# Patient Record
Sex: Male | Born: 1965 | Race: Black or African American | Hispanic: No | Marital: Single | Smoking: Current every day smoker
Health system: Southern US, Community
[De-identification: ages and names within clinical notes are randomized; demographics above are authoritative.]

## PROBLEM LIST (undated history)

## (undated) DIAGNOSIS — Z789 Other specified health status: Secondary | ICD-10-CM

## (undated) DIAGNOSIS — K56609 Unspecified intestinal obstruction, unspecified as to partial versus complete obstruction: Secondary | ICD-10-CM

## (undated) DIAGNOSIS — F1010 Alcohol abuse, uncomplicated: Secondary | ICD-10-CM

## (undated) HISTORY — PX: NO PAST SURGERIES: SHX2092

---

## 2005-04-22 ENCOUNTER — Observation Stay (HOSPITAL_COMMUNITY): Admission: RE | Admit: 2005-04-22 | Discharge: 2005-04-23 | Payer: Self-pay | Admitting: Urology

## 2005-04-22 ENCOUNTER — Encounter (INDEPENDENT_AMBULATORY_CARE_PROVIDER_SITE_OTHER): Payer: Self-pay | Admitting: Urology

## 2010-05-02 ENCOUNTER — Emergency Department (HOSPITAL_COMMUNITY): Admission: EM | Admit: 2010-05-02 | Discharge: 2010-05-02 | Payer: Self-pay | Admitting: Emergency Medicine

## 2010-12-06 NOTE — Op Note (Signed)
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   ANESTHESIA:  General endotracheal.   PROCEDURE:  The patient was given general endotracheal anesthesia, placed in  lithotomy position, usual prep and drape. There are large, giant condylomas  located in the left groin and right thigh, medial aspect. They were  circumscribed with the help of the cautery and excised totally, lifting off  the subcutaneous tissue. Bleeders were coagulated and skin was closed with  interrupted sutures of 3-0 Prolene. There was some large condylomas located  on the penile shaft. They were excised and skin closed with chromic stitch.  The multiple small penile condylomas were simply fulgurated. There was large  perianal condylomas. They were excised with the help of scissors. Then the  base was simply cauterized. Next, I proceed to remove the urethral  condylomas which are coming out of the meatus. They were fulgurated and  removed. I had to do ventral meatotomy to get to them. Whatever visual  portions, they were removed, and I did a cystoscopy. The rest of the urethra  and the bladder looks fine, and I left a #18 Foley catheter, and with a  couple of sutures of chromic, the meatus was closed on the ventral aspect to  approximate the glans penis. Then bacitracin ointment applied to the area  where the joint condylomas were removed, and they were dressed. The rest of  the area was simply applied bacitracin. The patient left the operating room  in satisfactory condition.      Ky Barban, M.D.  Electronically Signed     MIJ/MEDQ  D:  04/22/2005  T:  04/22/2005  Job:  [id number]

## 2010-12-06 NOTE — H&P (Signed)
NAME:  Christopher English, Christopher English NO.:  [id number]   MEDICAL RECORD NO.:  [id number]          PATIENT TYPE:  AMB   LOCATION:  DAY                           FACILITY:  [facility]   PHYSICIAN:  Ky Barban, M.D.DATE OF BIRTH:  [birth year]   DATE OF ADMISSION:  DATE OF DISCHARGE:  LH                                HISTORY & PHYSICAL   CHIEF COMPLAINT:  Multiple genital warts.   HISTORY OF PRESENT ILLNESS:  A 45 year old gentleman who was referred to me  by Dr. __________.  He was found to have multiple warts, which are giant,  located for several years in the genital area; on the penis, scrotum, and on  his anus.  No other urological complaints.  I have advised him to have  excision and fulguration of these warts under anesthesia as an outpatient.   PAST HISTORY:  No history of diabetes or hypertension.   FAMILY HISTORY:  Negative.   ALLERGIES:  NONE.   MEDICATIONS:  None.   EXAMINATION:  VITAL SIGNS:  Blood pressure 140/86, temperature 98.7.  CENTRAL NERVOUS SYSTEM:  Negative.  HEAD/NECK/ENT:  Negative.  CHEST:  Symmetrical.  HEART:  Regular sinus rhythm.  ABDOMEN:  Soft, flat.  Liver, spleen, and kidneys are not palpable.  No  severe tenderness.  GENITOURINARY:  External genitalia reveals multiple warts on the penal shaft  and the scrotum.  They are big and some of them appear to be infected.  There are some around the anal area and I also see one coming out of the  urethral meatus.   IMPRESSION:  Multiple genital warts.   PLAN:  Excision under anesthesia.  I may have to even do a cystoscopy.  It  depends what we find.  He understands and wants me to go ahead and proceed.     Ky Barban, M.D.  Electronically Signed    MIJ/MEDQ  D:  04/21/2005  T:  04/21/2005  Job:  [id number]

## 2011-07-10 ENCOUNTER — Inpatient Hospital Stay (HOSPITAL_COMMUNITY)
Admission: EM | Admit: 2011-07-10 | Discharge: 2011-07-14 | DRG: 201 | Disposition: A | Discharge status: Home / Self Care | Payer: Self-pay

## 2011-07-10 ENCOUNTER — Emergency Department (HOSPITAL_COMMUNITY): Payer: Self-pay

## 2011-07-10 ENCOUNTER — Encounter (HOSPITAL_COMMUNITY): Payer: Self-pay | Admitting: Physician Assistant

## 2011-07-10 DIAGNOSIS — S41012A Laceration without foreign body of left shoulder, initial encounter: Secondary | ICD-10-CM | POA: Diagnosis present

## 2011-07-10 DIAGNOSIS — Y92009 Unspecified place in unspecified non-institutional (private) residence as the place of occurrence of the external cause: Secondary | ICD-10-CM

## 2011-07-10 DIAGNOSIS — Y998 Other external cause status: Secondary | ICD-10-CM

## 2011-07-10 DIAGNOSIS — S21209A Unspecified open wound of unspecified back wall of thorax without penetration into thoracic cavity, initial encounter: Secondary | ICD-10-CM | POA: Diagnosis present

## 2011-07-10 DIAGNOSIS — S2190XA Unspecified open wound of unspecified part of thorax, initial encounter: Principal | ICD-10-CM | POA: Diagnosis present

## 2011-07-10 DIAGNOSIS — S0191XA Laceration without foreign body of unspecified part of head, initial encounter: Secondary | ICD-10-CM | POA: Diagnosis present

## 2011-07-10 DIAGNOSIS — S41009A Unspecified open wound of unspecified shoulder, initial encounter: Secondary | ICD-10-CM

## 2011-07-10 DIAGNOSIS — S0100XA Unspecified open wound of scalp, initial encounter: Secondary | ICD-10-CM | POA: Diagnosis present

## 2011-07-10 DIAGNOSIS — Z720 Tobacco use: Secondary | ICD-10-CM

## 2011-07-10 DIAGNOSIS — S270XXA Traumatic pneumothorax, initial encounter: Secondary | ICD-10-CM

## 2011-07-10 DIAGNOSIS — S0180XA Unspecified open wound of other part of head, initial encounter: Secondary | ICD-10-CM | POA: Diagnosis present

## 2011-07-10 DIAGNOSIS — S21119A Laceration without foreign body of unspecified front wall of thorax without penetration into thoracic cavity, initial encounter: Secondary | ICD-10-CM | POA: Diagnosis present

## 2011-07-10 DIAGNOSIS — T148XXA Other injury of unspecified body region, initial encounter: Secondary | ICD-10-CM

## 2011-07-10 DIAGNOSIS — S0190XA Unspecified open wound of unspecified part of head, initial encounter: Secondary | ICD-10-CM

## 2011-07-10 HISTORY — DX: Other specified health status: Z78.9

## 2011-07-10 LAB — CBC
HCT: 42.3 % (ref 39.0–52.0)
Hemoglobin: 14.2 g/dL (ref 13.0–17.0)
MCH: 32.1 pg (ref 26.0–34.0)
MCHC: 33.6 g/dL (ref 30.0–36.0)
MCV: 95.7 fL (ref 78.0–100.0)
RDW: 12 % (ref 11.5–15.5)

## 2011-07-10 LAB — URINALYSIS, MICROSCOPIC ONLY
Bilirubin Urine: NEGATIVE
Glucose, UA: NEGATIVE mg/dL
Protein, ur: 30 mg/dL — AB
Urobilinogen, UA: 0.2 mg/dL (ref 0.0–1.0)

## 2011-07-10 LAB — POCT I-STAT, CHEM 8
Calcium, Ion: 1.13 mmol/L (ref 1.12–1.32)
Glucose, Bld: 133 mg/dL — ABNORMAL HIGH (ref 70–99)
HCT: 45 % (ref 39.0–52.0)
Hemoglobin: 15.3 g/dL (ref 13.0–17.0)

## 2011-07-10 LAB — COMPREHENSIVE METABOLIC PANEL
ALT: 24 U/L (ref 0–53)
AST: 37 U/L (ref 0–37)
Albumin: 3.8 g/dL (ref 3.5–5.2)
Alkaline Phosphatase: 61 U/L (ref 39–117)
Calcium: 9.1 mg/dL (ref 8.4–10.5)
GFR calc Af Amer: 82 mL/min — ABNORMAL LOW (ref >90)
Glucose, Bld: 134 mg/dL — ABNORMAL HIGH (ref 70–99)
Potassium: 3.7 mEq/L (ref 3.5–5.1)
Sodium: 136 mEq/L (ref 135–145)
Total Protein: 7.1 g/dL (ref 6.0–8.3)

## 2011-07-10 LAB — PROTIME-INR: Prothrombin Time: 13.8 seconds (ref 11.6–15.2)

## 2011-07-10 MED ORDER — MORPHINE SULFATE 2 MG/ML IJ SOLN
INTRAMUSCULAR | Status: AC
  Administered 2011-07-11: 2 mg via INTRAVENOUS
  Filled 2011-07-10: qty 1

## 2011-07-10 MED ORDER — IOHEXOL 350 MG/ML SOLN
80.0000 mL | Freq: Once | INTRAVENOUS | Status: AC | PRN
  Administered 2011-07-10: 80 mL via INTRAVENOUS

## 2011-07-10 MED ORDER — POTASSIUM CHLORIDE IN NACL 20-0.9 MEQ/L-% IV SOLN
INTRAVENOUS | Status: DC
  Administered 2011-07-10: 17:00:00 via INTRAVENOUS
  Administered 2011-07-11: 20 mL/h via INTRAVENOUS
  Administered 2011-07-12: 05:00:00 via INTRAVENOUS
  Filled 2011-07-10 (×4): qty 1000

## 2011-07-10 MED ORDER — HYDROCODONE-ACETAMINOPHEN 5-325 MG PO TABS
2.0000 | ORAL_TABLET | ORAL | Status: DC | PRN
  Administered 2011-07-10 – 2011-07-13 (×8): 2 via ORAL
  Filled 2011-07-10 (×8): qty 2

## 2011-07-10 MED ORDER — TETANUS-DIPHTH-ACELL PERTUSSIS 5-2.5-18.5 LF-MCG/0.5 IM SUSP
0.5000 mL | Freq: Once | INTRAMUSCULAR | Status: AC
  Administered 2011-07-10: 0.5 mL via INTRAMUSCULAR

## 2011-07-10 MED ORDER — TETANUS-DIPHTH-ACELL PERTUSSIS 5-2.5-18.5 LF-MCG/0.5 IM SUSP
INTRAMUSCULAR | Status: AC
  Filled 2011-07-10: qty 0.5

## 2011-07-10 MED ORDER — DEXTROSE 5 % IV SOLN
1000.0000 mg | INTRAVENOUS | Status: AC | PRN
  Administered 2011-07-10: 1000 mg via INTRAVENOUS

## 2011-07-10 MED ORDER — FENTANYL CITRATE 0.05 MG/ML IJ SOLN
INTRAMUSCULAR | Status: AC | PRN
  Administered 2011-07-10: 100 ug via INTRAVENOUS

## 2011-07-10 MED ORDER — PANTOPRAZOLE SODIUM 40 MG IV SOLR
40.0000 mg | Freq: Every day | INTRAVENOUS | Status: DC
  Administered 2011-07-10: 40 mg via INTRAVENOUS
  Filled 2011-07-10 (×5): qty 40

## 2011-07-10 MED ORDER — BISACODYL 10 MG RE SUPP: 10.0000 mg | Freq: Every day | RECTAL | Status: DC | PRN

## 2011-07-10 MED ORDER — PANTOPRAZOLE SODIUM 40 MG PO TBEC
40.0000 mg | DELAYED_RELEASE_TABLET | Freq: Every day | ORAL | Status: DC
  Administered 2011-07-11 – 2011-07-13 (×3): 40 mg via ORAL
  Filled 2011-07-10 (×3): qty 1

## 2011-07-10 MED ORDER — FENTANYL CITRATE 0.05 MG/ML IJ SOLN
INTRAMUSCULAR | Status: AC
  Filled 2011-07-10: qty 2

## 2011-07-10 MED ORDER — DOCUSATE SODIUM 100 MG PO CAPS
100.0000 mg | ORAL_CAPSULE | Freq: Two times a day (BID) | ORAL | Status: DC
  Administered 2011-07-10 – 2011-07-13 (×7): 100 mg via ORAL
  Filled 2011-07-10 (×8): qty 1

## 2011-07-10 MED ORDER — HYDROCODONE-ACETAMINOPHEN 5-325 MG PO TABS: 0.5000 | ORAL_TABLET | ORAL | Status: DC | PRN

## 2011-07-10 MED ORDER — CEFAZOLIN SODIUM 1-5 GM-% IV SOLN
INTRAVENOUS | Status: AC
  Filled 2011-07-10: qty 50

## 2011-07-10 MED ORDER — MIDAZOLAM HCL 5 MG/5ML IJ SOLN
INTRAMUSCULAR | Status: AC | PRN
  Administered 2011-07-10: 4 mg via INTRAVENOUS

## 2011-07-10 MED ORDER — MIDAZOLAM HCL 2 MG/2ML IJ SOLN
INTRAMUSCULAR | Status: AC
  Filled 2011-07-10: qty 4

## 2011-07-10 MED ORDER — ONDANSETRON HCL 4 MG/2ML IJ SOLN: 4.0000 mg | Freq: Four times a day (QID) | INTRAMUSCULAR | Status: DC | PRN

## 2011-07-10 MED ORDER — SODIUM CHLORIDE 0.9 % IV BOLUS (SEPSIS)
1000.0000 mL | Freq: Once | INTRAVENOUS | Status: AC
  Administered 2011-07-10: 1000 mL via INTRAVENOUS

## 2011-07-10 MED ORDER — ONDANSETRON HCL 4 MG PO TABS: 4.0000 mg | ORAL_TABLET | Freq: Four times a day (QID) | ORAL | Status: DC | PRN

## 2011-07-10 MED ORDER — MORPHINE SULFATE 2 MG/ML IJ SOLN
2.0000 mg | INTRAMUSCULAR | Status: DC | PRN
  Administered 2011-07-10 – 2011-07-13 (×9): 2 mg via INTRAVENOUS
  Filled 2011-07-10 (×9): qty 1

## 2011-07-10 MED ORDER — HYDROCODONE-ACETAMINOPHEN 5-325 MG PO TABS
1.0000 | ORAL_TABLET | ORAL | Status: DC | PRN
  Administered 2011-07-11 (×2): 1 via ORAL
  Filled 2011-07-10 (×2): qty 1
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
## 2011-07-10 NOTE — Procedures (Signed)
The physician assistant was supervised during this procedure, and 28Fr trocar CT placed without event.  CXR done confirmed good placement.

## 2011-07-10 NOTE — Procedures (Signed)
Chest Tube Insertion Procedure Note  Indications:  Clinically significant Pneumothorax and Hemothorax approx 30%  Pre-operative Diagnosis: Pneumothorax and Hemothorax  Post-operative Diagnosis: Pneumothorax and Hemothorax  Procedure Details  Informed consent was obtained for the procedure, including sedation.  Risks of lung perforation, hemorrhage, arrhythmia, and adverse drug reaction were discussed.   After sterile skin prep, using standard technique, a 28 French tube was placed in the left lateral 4th rib space.  Findings: Immediate release of air  Estimated Blood Loss:  Minimal         Specimens:  None              Complications:  None; patient tolerated the procedure well.         Disposition: SDU         Condition: stable  Attending: Dr Jimmye Norman   Physician Assistant: Iver Nestle, PAC

## 2011-07-10 NOTE — ED Notes (Signed)
Left hand laceration repaired with sutures by md
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
## 2011-07-10 NOTE — ED Notes (Signed)
[id number] ready 
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
## 2011-07-10 NOTE — ED Notes (Signed)
Er md in to repair lacerations to scalp with staples.

## 2011-07-10 NOTE — ED Notes (Signed)
md removing forehead staple and placing sutures instead.

## 2011-07-10 NOTE — ED Notes (Signed)
Lacerations to left shoulder and forehead stapled as well.
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
## 2011-07-10 NOTE — ED Notes (Signed)
Back from ct, aware of plans for chest tube.

## 2011-07-10 NOTE — ED Notes (Signed)
co2 result given to Md at bedside.

## 2011-07-11 ENCOUNTER — Inpatient Hospital Stay (HOSPITAL_COMMUNITY): Payer: Self-pay

## 2011-07-11 LAB — CBC
HCT: 37.4 % — ABNORMAL LOW (ref 39.0–52.0)
MCH: 31.2 pg (ref 26.0–34.0)
MCHC: 33.4 g/dL (ref 30.0–36.0)
MCV: 93.3 fL (ref 78.0–100.0)
Platelets: 115 10*3/uL — ABNORMAL LOW (ref 150–400)
RDW: 12.1 % (ref 11.5–15.5)
WBC: 6.1 10*3/uL (ref 4.0–10.5)

## 2011-07-11 LAB — TYPE AND SCREEN
ABO/RH(D): O POS
Antibody Screen: NEGATIVE
Unit division: 0
Unit division: 0

## 2011-07-11 LAB — PROTIME-INR: Prothrombin Time: 13.3 seconds (ref 11.6–15.2)

## 2011-07-11 LAB — BASIC METABOLIC PANEL
BUN: 8 mg/dL (ref 6–23)
Calcium: 8.9 mg/dL (ref 8.4–10.5)
Chloride: 102 mEq/L (ref 96–112)
Creatinine, Ser: 0.83 mg/dL (ref 0.50–1.35)
GFR calc Af Amer: >90 mL/min (ref >90)

## 2011-07-11 MED ORDER — IPRATROPIUM-ALBUTEROL 18-103 MCG/ACT IN AERO
2.0000 | INHALATION_SPRAY | Freq: Four times a day (QID) | RESPIRATORY_TRACT | Status: DC | PRN
  Filled 2011-07-11: qty 14.7
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
## 2011-07-12 ENCOUNTER — Inpatient Hospital Stay (HOSPITAL_COMMUNITY): Payer: Self-pay

## 2011-07-12 LAB — CBC
HCT: 36.2 % — ABNORMAL LOW (ref 39.0–52.0)
MCH: 31.2 pg (ref 26.0–34.0)
MCHC: 33.7 g/dL (ref 30.0–36.0)
MCV: 92.6 fL (ref 78.0–100.0)
RDW: 11.9 % (ref 11.5–15.5)
WBC: 3.9 10*3/uL — ABNORMAL LOW (ref 4.0–10.5)

## 2011-07-12 LAB — BASIC METABOLIC PANEL
BUN: 6 mg/dL (ref 6–23)
Calcium: 9.2 mg/dL (ref 8.4–10.5)
Chloride: 102 mEq/L (ref 96–112)
Creatinine, Ser: 0.88 mg/dL (ref 0.50–1.35)
GFR calc Af Amer: >90 mL/min (ref >90)
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
## 2011-07-13 ENCOUNTER — Inpatient Hospital Stay (HOSPITAL_COMMUNITY): Payer: Self-pay

## 2011-07-13 NOTE — Progress Notes (Signed)
  Subjective: Doing well. Tolerated CT to water seal. No SOB. MIld pains  Objective: Vital signs in last 24 hours: Temp:  [98.3 F (36.8 C)-98.6 F (37 C)] 98.3 F (36.8 C) (12/23 0500) Pulse Rate:  [64-80] 64  (12/23 0500) Resp:  [18-20] 18  (12/23 0500) BP: (123-146)/(78-83) 133/78 mmHg (12/23 0500) SpO2:  [96 %-100 %] 100 % (12/23 0500) Last BM Date: 07/09/11  Intake/Output this shift:    Physical Exam: BP 133/78  Pulse 64  Temp(Src) 98.3 F (36.8 C) (Oral)  Resp 18  Ht 5\' 8"  (1.727 m)  Wt 152 lb (68.947 kg)  BMI 23.11 kg/m2  SpO2 100% Lungs: CTA, no air leak  Labs: CBC  Basename 07/12/11 0700 07/11/11 0420  WBC 3.9* 6.1  HGB 12.2* 12.5*  HCT 36.2* 37.4*  PLT 117* 115*   BMET  Basename 07/12/11 0700 07/11/11 0420  NA 138 133*  K 3.7 4.0  CL 102 102  CO2 25 22  GLUCOSE 87 99  BUN 6 8  CREATININE 0.88 0.83  CALCIUM 9.2 8.9   LFT  Basename 07/10/11 1356  PROT 7.1  ALBUMIN 3.8  AST 37  ALT 24  ALKPHOS 61  BILITOT 0.2*  BILIDIR --  IBILI --  LIPASE --   PT/INR  Basename 07/11/11 0420 07/10/11 1356  LABPROT 13.3 13.8  INR 0.99 1.04   ABG No results found for this basename: PHART:2,PCO2:2,PO2:2,HCO3:2 in the last 72 hours  Studies/Results: Dg Chest Port 1 View  07/12/2011  *RADIOLOGY REPORT*  Clinical Data: Pneumothorax, chest pain  PORTABLE CHEST - 1 VIEW  Comparison:   the previous day's study  Findings: Left chest tube stable in position.  No pneumothorax. Improved aeration with minimal residual infrahilar subsegmental atelectasis or infiltrates, left greater than right.  No definite effusion.  Heart size normal.  IMPRESSION:  1.  Stable left chest with no pneumothorax. 2.  Improved aeration with minimal residual infrahilar atelectasis/infiltrates.  Original Report Authenticated By: Osa Craver, M.D.    Assessment: Active Problems:  * No active hospital problems. *      Plan: Await CXR report, hopefully DC CT later  today  LOS: 3 days    Ahrianna Siglin,Plummer J 07/13/2011

## 2011-07-13 NOTE — Progress Notes (Signed)
CXR ok, no PTX on water seal. Chest tube pulled, no issues, no SOB Air-tight dressing placed.
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
## 2011-07-14 DIAGNOSIS — Z720 Tobacco use: Secondary | ICD-10-CM

## 2011-07-14 MED ORDER — IPRATROPIUM-ALBUTEROL 18-103 MCG/ACT IN AERO: 2.0000 | INHALATION_SPRAY | Freq: Four times a day (QID) | RESPIRATORY_TRACT | Status: DC | PRN

## 2011-07-14 MED ORDER — HYDROCODONE-ACETAMINOPHEN 5-325 MG PO TABS: 1.0000 | ORAL_TABLET | ORAL | Status: AC | PRN
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
## 2011-07-14 NOTE — Discharge Summary (Signed)
Agree Hassaan Crite E  

## 2011-07-14 NOTE — Progress Notes (Signed)
Agree Christopher English  

## 2011-07-24 ENCOUNTER — Encounter (INDEPENDENT_AMBULATORY_CARE_PROVIDER_SITE_OTHER): Payer: Self-pay

## 2011-10-26 ENCOUNTER — Emergency Department (HOSPITAL_COMMUNITY)
Admission: EM | Admit: 2011-10-26 | Discharge: 2011-10-26 | Disposition: A | Discharge status: Home / Self Care | Payer: Self-pay | Attending: Emergency Medicine | Admitting: Emergency Medicine

## 2011-10-26 ENCOUNTER — Encounter (HOSPITAL_COMMUNITY): Payer: Self-pay

## 2011-10-26 DIAGNOSIS — R4182 Altered mental status, unspecified: Secondary | ICD-10-CM | POA: Insufficient documentation

## 2011-10-26 DIAGNOSIS — Z1331 Encounter for screening for depression: Secondary | ICD-10-CM | POA: Insufficient documentation

## 2011-10-26 HISTORY — DX: Alcohol abuse, uncomplicated: F10.10

## 2011-10-26 LAB — COMPREHENSIVE METABOLIC PANEL WITH GFR
ALT: 50 U/L (ref 0–53)
AST: 85 U/L — ABNORMAL HIGH (ref 0–37)
Albumin: 4.3 g/dL (ref 3.5–5.2)
Alkaline Phosphatase: 65 U/L (ref 39–117)
BUN: 9 mg/dL (ref 6–23)
CO2: 25 meq/L (ref 19–32)
Calcium: 9.4 mg/dL (ref 8.4–10.5)
Chloride: 102 meq/L (ref 96–112)
Creatinine, Ser: 0.89 mg/dL (ref 0.50–1.35)
GFR calc Af Amer: >90 mL/min (ref >90)
GFR calc non Af Amer: >90 mL/min (ref >90)
Glucose, Bld: 88 mg/dL (ref 70–99)
Potassium: 3.9 meq/L (ref 3.5–5.1)
Sodium: 141 meq/L (ref 135–145)
Total Bilirubin: 0.2 mg/dL — ABNORMAL LOW (ref 0.3–1.2)
Total Protein: 7.5 g/dL (ref 6.0–8.3)

## 2011-10-26 LAB — ETHANOL: Alcohol, Ethyl (B): 177 mg/dL — ABNORMAL HIGH (ref 0–11)

## 2011-10-26 LAB — CBC
HCT: 43.3 % (ref 39.0–52.0)
Hemoglobin: 14.6 g/dL (ref 13.0–17.0)
MCH: 31.7 pg (ref 26.0–34.0)
MCHC: 33.7 g/dL (ref 30.0–36.0)
MCV: 93.9 fL (ref 78.0–100.0)
Platelets: 109 K/uL — ABNORMAL LOW (ref 150–400)
RBC: 4.61 MIL/uL (ref 4.22–5.81)
RDW: 12.5 % (ref 11.5–15.5)
WBC: 3.4 K/uL — ABNORMAL LOW (ref 4.0–10.5)

## 2011-10-26 LAB — URINALYSIS, ROUTINE W REFLEX MICROSCOPIC
Glucose, UA: NEGATIVE mg/dL
Hgb urine dipstick: NEGATIVE
Ketones, ur: NEGATIVE mg/dL
Protein, ur: NEGATIVE mg/dL

## 2011-10-26 LAB — RAPID URINE DRUG SCREEN, HOSP PERFORMED
Amphetamines: NOT DETECTED
Barbiturates: NOT DETECTED
Benzodiazepines: POSITIVE — AB
Cocaine: NOT DETECTED
Opiates: NOT DETECTED
Tetrahydrocannabinol: POSITIVE — AB
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
## 2011-10-26 NOTE — ED Notes (Signed)
Brought to er by [facility], w./ ivc papers in place, stated he wanted to kill self--was told to his mother. Pt denies any comment, denies si/hi

## 2011-10-26 NOTE — Discharge Instructions (Signed)
Follow up as needed

## 2011-10-26 NOTE — ED Notes (Signed)
Pt DC to home with instructions to return for worsening sx or SI/HI.  Pt verbalized understanding.  Pt given clothes and belongings
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
## 2018-06-20 DIAGNOSIS — K56609 Unspecified intestinal obstruction, unspecified as to partial versus complete obstruction: Secondary | ICD-10-CM

## 2018-06-20 HISTORY — DX: Unspecified intestinal obstruction, unspecified as to partial versus complete obstruction: K56.609

## 2018-06-21 ENCOUNTER — Encounter (HOSPITAL_COMMUNITY): Payer: Self-pay

## 2018-06-21 ENCOUNTER — Inpatient Hospital Stay (HOSPITAL_COMMUNITY)
Admission: EM | Admit: 2018-06-21 | Discharge: 2018-07-21 | DRG: 388 | Disposition: E | Payer: Medicaid Other | Attending: Pulmonary Disease | Admitting: Pulmonary Disease

## 2018-06-21 ENCOUNTER — Emergency Department (HOSPITAL_COMMUNITY): Payer: Medicaid Other

## 2018-06-21 ENCOUNTER — Inpatient Hospital Stay (HOSPITAL_COMMUNITY): Payer: Medicaid Other

## 2018-06-21 DIAGNOSIS — K56699 Other intestinal obstruction unspecified as to partial versus complete obstruction: Secondary | ICD-10-CM | POA: Diagnosis present

## 2018-06-21 DIAGNOSIS — Z7189 Other specified counseling: Secondary | ICD-10-CM

## 2018-06-21 DIAGNOSIS — D696 Thrombocytopenia, unspecified: Secondary | ICD-10-CM

## 2018-06-21 DIAGNOSIS — C189 Malignant neoplasm of colon, unspecified: Secondary | ICD-10-CM | POA: Diagnosis present

## 2018-06-21 DIAGNOSIS — R6881 Early satiety: Secondary | ICD-10-CM | POA: Diagnosis present

## 2018-06-21 DIAGNOSIS — R6521 Severe sepsis with septic shock: Secondary | ICD-10-CM | POA: Diagnosis not present

## 2018-06-21 DIAGNOSIS — N179 Acute kidney failure, unspecified: Secondary | ICD-10-CM | POA: Diagnosis present

## 2018-06-21 DIAGNOSIS — Z515 Encounter for palliative care: Secondary | ICD-10-CM | POA: Diagnosis present

## 2018-06-21 DIAGNOSIS — E874 Mixed disorder of acid-base balance: Secondary | ICD-10-CM | POA: Diagnosis not present

## 2018-06-21 DIAGNOSIS — Z720 Tobacco use: Secondary | ICD-10-CM | POA: Diagnosis present

## 2018-06-21 DIAGNOSIS — Z4659 Encounter for fitting and adjustment of other gastrointestinal appliance and device: Secondary | ICD-10-CM

## 2018-06-21 DIAGNOSIS — F1010 Alcohol abuse, uncomplicated: Secondary | ICD-10-CM

## 2018-06-21 DIAGNOSIS — I4901 Ventricular fibrillation: Secondary | ICD-10-CM | POA: Diagnosis not present

## 2018-06-21 DIAGNOSIS — Z978 Presence of other specified devices: Secondary | ICD-10-CM

## 2018-06-21 DIAGNOSIS — E871 Hypo-osmolality and hyponatremia: Secondary | ICD-10-CM | POA: Diagnosis present

## 2018-06-21 DIAGNOSIS — E119 Type 2 diabetes mellitus without complications: Secondary | ICD-10-CM | POA: Diagnosis present

## 2018-06-21 DIAGNOSIS — R579 Shock, unspecified: Secondary | ICD-10-CM

## 2018-06-21 DIAGNOSIS — K6389 Other specified diseases of intestine: Secondary | ICD-10-CM | POA: Diagnosis present

## 2018-06-21 DIAGNOSIS — I469 Cardiac arrest, cause unspecified: Secondary | ICD-10-CM | POA: Diagnosis not present

## 2018-06-21 DIAGNOSIS — F1020 Alcohol dependence, uncomplicated: Secondary | ICD-10-CM | POA: Diagnosis present

## 2018-06-21 DIAGNOSIS — E860 Dehydration: Secondary | ICD-10-CM | POA: Diagnosis present

## 2018-06-21 DIAGNOSIS — D6959 Other secondary thrombocytopenia: Secondary | ICD-10-CM | POA: Diagnosis present

## 2018-06-21 DIAGNOSIS — E876 Hypokalemia: Secondary | ICD-10-CM | POA: Diagnosis present

## 2018-06-21 DIAGNOSIS — J690 Pneumonitis due to inhalation of food and vomit: Secondary | ICD-10-CM | POA: Diagnosis not present

## 2018-06-21 DIAGNOSIS — J9601 Acute respiratory failure with hypoxia: Secondary | ICD-10-CM | POA: Diagnosis not present

## 2018-06-21 DIAGNOSIS — A419 Sepsis, unspecified organism: Secondary | ICD-10-CM | POA: Diagnosis not present

## 2018-06-21 DIAGNOSIS — K56609 Unspecified intestinal obstruction, unspecified as to partial versus complete obstruction: Secondary | ICD-10-CM | POA: Diagnosis present

## 2018-06-21 DIAGNOSIS — F17210 Nicotine dependence, cigarettes, uncomplicated: Secondary | ICD-10-CM | POA: Diagnosis present

## 2018-06-21 DIAGNOSIS — Z9289 Personal history of other medical treatment: Secondary | ICD-10-CM

## 2018-06-21 DIAGNOSIS — R1084 Generalized abdominal pain: Secondary | ICD-10-CM | POA: Diagnosis present

## 2018-06-21 DIAGNOSIS — Z452 Encounter for adjustment and management of vascular access device: Secondary | ICD-10-CM

## 2018-06-21 HISTORY — DX: Unspecified intestinal obstruction, unspecified as to partial versus complete obstruction: K56.609

## 2018-06-21 LAB — CBC WITH DIFFERENTIAL/PLATELET
Abs Immature Granulocytes: 0.02 10*3/uL (ref 0.00–0.07)
BASOS ABS: 0 10*3/uL (ref 0.0–0.1)
Basophils Relative: 0 %
EOS PCT: 0 %
Eosinophils Absolute: 0 10*3/uL (ref 0.0–0.5)
HEMATOCRIT: 50.2 % (ref 39.0–52.0)
HEMOGLOBIN: 16.9 g/dL (ref 13.0–17.0)
IMMATURE GRANULOCYTES: 0 %
LYMPHS ABS: 1.1 10*3/uL (ref 0.7–4.0)
LYMPHS PCT: 15 %
MCH: 31.5 pg (ref 26.0–34.0)
MCHC: 33.7 g/dL (ref 30.0–36.0)
MCV: 93.5 fL (ref 80.0–100.0)
MONOS PCT: 6 %
Monocytes Absolute: 0.4 10*3/uL (ref 0.1–1.0)
NRBC: 0 % (ref 0.0–0.2)
Neutro Abs: 5.5 10*3/uL (ref 1.7–7.7)
Neutrophils Relative %: 79 %
Platelets: 138 10*3/uL — ABNORMAL LOW (ref 150–400)
RBC: 5.37 MIL/uL (ref 4.22–5.81)
RDW: 12 % (ref 11.5–15.5)
WBC: 7 10*3/uL (ref 4.0–10.5)

## 2018-06-21 LAB — COMPREHENSIVE METABOLIC PANEL
ALK PHOS: 70 U/L (ref 38–126)
ALT: 19 U/L (ref 0–44)
AST: 25 U/L (ref 15–41)
Albumin: 4.7 g/dL (ref 3.5–5.0)
Anion gap: 15 (ref 5–15)
BILIRUBIN TOTAL: 0.9 mg/dL (ref 0.3–1.2)
BUN: 19 mg/dL (ref 6–20)
CO2: 23 mmol/L (ref 22–32)
Calcium: 9.8 mg/dL (ref 8.9–10.3)
Chloride: 96 mmol/L — ABNORMAL LOW (ref 98–111)
Creatinine, Ser: 1.54 mg/dL — ABNORMAL HIGH (ref 0.61–1.24)
GFR calc Af Amer: 59 mL/min — ABNORMAL LOW (ref >60)
GFR calc non Af Amer: 51 mL/min — ABNORMAL LOW (ref >60)
GLUCOSE: 137 mg/dL — AB (ref 70–99)
Potassium: 3.2 mmol/L — ABNORMAL LOW (ref 3.5–5.1)
Sodium: 134 mmol/L — ABNORMAL LOW (ref 135–145)
Total Protein: 8.6 g/dL — ABNORMAL HIGH (ref 6.5–8.1)

## 2018-06-21 LAB — I-STAT TROPONIN, ED: Troponin i, poc: 0.01 ng/mL (ref 0.00–0.08)

## 2018-06-21 LAB — LIPASE, BLOOD: Lipase: 20 U/L (ref 11–51)

## 2018-06-21 MED ORDER — MORPHINE SULFATE (PF) 4 MG/ML IV SOLN
4.0000 mg | Freq: Once | INTRAVENOUS | Status: AC
  Administered 2018-06-21: 4 mg via INTRAVENOUS
  Filled 2018-06-21: qty 1

## 2018-06-21 MED ORDER — PROPOFOL 10 MG/ML IV BOLUS: 0.5000 mg/kg | Freq: Once | INTRAVENOUS | Status: DC

## 2018-06-21 MED ORDER — LACTATED RINGERS IV SOLN
INTRAVENOUS | Status: DC
  Administered 2018-06-22: via INTRAVENOUS

## 2018-06-21 MED ORDER — ONDANSETRON HCL 4 MG/2ML IJ SOLN
4.0000 mg | Freq: Once | INTRAMUSCULAR | Status: AC
  Administered 2018-06-21: 4 mg via INTRAVENOUS
  Filled 2018-06-21: qty 2

## 2018-06-21 MED ORDER — HYDROMORPHONE HCL 1 MG/ML IJ SOLN
0.5000 mg | INTRAMUSCULAR | Status: DC | PRN
  Administered 2018-06-22 – 2018-06-23 (×7): 0.5 mg via INTRAVENOUS
  Filled 2018-06-21 (×7): qty 1

## 2018-06-21 MED ORDER — LIDOCAINE HCL (PF) 1 % IJ SOLN
INTRAMUSCULAR | Status: AC
  Filled 2018-06-21: qty 5

## 2018-06-21 MED ORDER — SODIUM CHLORIDE 0.9 % IV BOLUS
1000.0000 mL | Freq: Once | INTRAVENOUS | Status: AC
  Administered 2018-06-21: 1000 mL via INTRAVENOUS

## 2018-06-21 MED ORDER — FOLIC ACID 1 MG PO TABS: 1.0000 mg | ORAL_TABLET | Freq: Every day | ORAL | Status: DC

## 2018-06-21 MED ORDER — SODIUM CHLORIDE 0.9 % IV BOLUS
500.0000 mL | Freq: Once | INTRAVENOUS | Status: AC
  Administered 2018-06-21: 500 mL via INTRAVENOUS

## 2018-06-21 MED ORDER — LORAZEPAM 1 MG PO TABS: 1.0000 mg | ORAL_TABLET | Freq: Four times a day (QID) | ORAL | Status: DC | PRN

## 2018-06-21 MED ORDER — POTASSIUM CHLORIDE 10 MEQ/100ML IV SOLN
10.0000 meq | INTRAVENOUS | Status: AC
  Administered 2018-06-22 (×4): 10 meq via INTRAVENOUS
  Filled 2018-06-21 (×4): qty 100

## 2018-06-21 MED ORDER — THIAMINE HCL 100 MG/ML IJ SOLN
100.0000 mg | Freq: Every day | INTRAMUSCULAR | Status: DC
  Administered 2018-06-22 – 2018-06-24 (×3): 100 mg via INTRAVENOUS
  Filled 2018-06-21 (×3): qty 2

## 2018-06-21 MED ORDER — ONDANSETRON HCL 4 MG/2ML IJ SOLN
4.0000 mg | Freq: Four times a day (QID) | INTRAMUSCULAR | Status: DC | PRN
  Administered 2018-06-22 – 2018-06-23 (×3): 4 mg via INTRAVENOUS
  Filled 2018-06-21 (×4): qty 2

## 2018-06-21 MED ORDER — LORAZEPAM 2 MG/ML IJ SOLN: 1.0000 mg | Freq: Four times a day (QID) | INTRAMUSCULAR | Status: DC | PRN

## 2018-06-21 MED ORDER — LORAZEPAM 2 MG/ML IJ SOLN
0.5000 mg | Freq: Once | INTRAMUSCULAR | Status: AC
  Administered 2018-06-21: 0.5 mg via INTRAVENOUS
  Filled 2018-06-21: qty 1

## 2018-06-21 MED ORDER — IOHEXOL 300 MG/ML  SOLN
100.0000 mL | Freq: Once | INTRAMUSCULAR | Status: AC | PRN
  Administered 2018-06-21: 100 mL via INTRAVENOUS

## 2018-06-21 MED ORDER — PROPOFOL 10 MG/ML IV BOLUS
INTRAVENOUS | Status: AC
  Filled 2018-06-21: qty 20

## 2018-06-21 MED ORDER — LIDOCAINE HCL URETHRAL/MUCOSAL 2 % EX GEL
1.0000 "application " | Freq: Once | CUTANEOUS | Status: DC
  Filled 2018-06-21: qty 20

## 2018-06-21 MED ORDER — ENOXAPARIN SODIUM 40 MG/0.4ML ~~LOC~~ SOLN: 40.0000 mg | SUBCUTANEOUS | Status: DC

## 2018-06-21 MED ORDER — ADULT MULTIVITAMIN W/MINERALS CH: 1.0000 | ORAL_TABLET | Freq: Every day | ORAL | Status: DC

## 2018-06-21 MED ORDER — VITAMIN B-1 100 MG PO TABS
100.0000 mg | ORAL_TABLET | Freq: Every day | ORAL | Status: DC
  Filled 2018-06-21 (×2): qty 1

## 2018-06-21 NOTE — ED Notes (Signed)
3 attempts to insert NG tube.  Patient unable to tolerate procedure despite .5mg  ativan and 4 mg morphine and trying smallest catheter.  Pt crying and visibly upset and anxious after 3rd attempt. Pt states he will not try this again unless fully sedated. RN and family at bedside consoled pt. MD made aware NG tube insertion attempts unsuccessful.

## 2018-06-21 NOTE — ED Triage Notes (Signed)
Pt presents for evaluation of abdominal pain, vomiting, unable to keep fluids down for 3 days. Hx of ETOH abuse.

## 2018-06-21 NOTE — Consult Note (Signed)
Surgical Consultation Requesting provider: Cortni Couture PA-C  CC: abdominal pain, bloating, emesis  HPI: 52yo man presents with 4 day history of diffuse abdominal pain, nausea, emesis/ PO intolerance. Denies flatus or BM since onset of symptoms. Has had intermittent similar symptoms previously which were brief and tolerable but this episode has been unrelenting. Denies recent weight loss. Denies recent change in bowel movements, melena or hematochezia.   Denies known medical problems, does not take any medications.  Denies prior abdominal surgery.   Denies FH of cancer, FH positive for diabetes.   Drinks 48oz of beer daily. 1ppd smoker x 35 years. 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   No Known Allergies  Past Medical History:  Diagnosis Date  . Alcohol abuse   . No pertinent past medical history     Past Surgical History:  Procedure Laterality Date  . NO PAST SURGERIES      No family history on file.  Social History   Socioeconomic History  . Marital status: Single    Spouse name: Not on file  . Number of children: Not on file  . Years of education: Not on file  . Highest education level: Not on file  Occupational History  . Not on file  Social Needs  . Financial resource strain: Not on file  . Food insecurity:    Worry: Not on file    Inability: Not on file  . Transportation needs:    Medical: Not on file    Non-medical: Not on file  Tobacco Use  . Smoking status: Current Every Day Smoker    Types: Cigarettes  . Smokeless tobacco: Never Used  Substance and Sexual Activity  . Alcohol use: Yes    Comment: 07/11/11 "he consumes alot of beer"/mother  . Drug use: No  . Sexual activity: Yes  Lifestyle  . Physical activity:    Days per week: Not on file    Minutes per session: Not on file  . Stress: Not on file  Relationships  . Social connections:    Talks on phone: Not on file    Gets together: Not on  file    Attends religious service: Not on file    Active member of club or organization: Not on file    Attends meetings of clubs or organizations: Not on file    Relationship status: Not on file  Other Topics Concern  . Not on file  Social History Narrative  . Not on file    No current facility-administered medications on file prior to encounter.    No current outpatient medications on file prior to encounter.    Review of Systems: a complete, 10pt review of systems was completed with pertinent positives and negatives as documented in the HPI  Physical Exam: Vitals:   06/23/2018 1419  BP: (!) 139/102  Pulse: 86  Resp: 16  Temp: 98.2 F (36.8 C)  SpO2: 100%   Gen: A&Ox3, no distress  Head: normocephalic, atraumatic Eyes: extraocular motions intact, anicteric.  Neck: supple without mass or thyromegaly Chest: unlabored respirations, symmetrical air entry, clear bilaterally   Cardiovascular: RRR with palpable distal pulses, no pedal edema Abdomen: soft, distended, diffusely tender without peritonitis. No mass or organomegaly. No surgical scars.  Extremities: warm, without edema, no deformities  Neuro: grossly intact Psych: appropriate mood and affect, normal insight  Skin: warm and dry   CBC Latest Ref Rng & Units 06/28/2018 10/26/2011 07/12/2011  WBC 4.0 -  10.5 K/uL 7.0 3.4(L) 3.9(L)  Hemoglobin 13.0 - 17.0 g/dL 16.9 14.6 12.2(L)  Hematocrit 39.0 - 52.0 % 50.2 43.3 36.2(L)  Platelets 150 - 400 K/uL 138(L) 109(L) 117(L)    CMP Latest Ref Rng & Units 06/23/2018 10/26/2011 07/12/2011  Glucose 70 - 99 mg/dL 137(H) 88 87  BUN 6 - 20 mg/dL 19 9 6   Creatinine 0.61 - 1.24 mg/dL 1.54(H) 0.89 0.88  Sodium 135 - 145 mmol/L 134(L) 141 138  Potassium 3.5 - 5.1 mmol/L 3.2(L) 3.9 3.7  Chloride 98 - 111 mmol/L 96(L) 102 102  CO2 22 - 32 mmol/L 23 25 25   Calcium 8.9 - 10.3 mg/dL 9.8 9.4 9.2  Total Protein 6.5 - 8.1 g/dL 8.6(H) 7.5 -  Total Bilirubin 0.3 - 1.2 mg/dL 0.9 0.2(L) -   Alkaline Phos 38 - 126 U/L 70 65 -  AST 15 - 41 U/L 25 85(H) -  ALT 0 - 44 U/L 19 50 -    Lab Results  Component Value Date   INR 0.99 07/11/2011   INR 1.04 07/10/2011        Imaging: Ct Abdomen Pelvis W Contrast  Result Date: 06/20/2018 CLINICAL DATA:  Abdominal pain and distention with nausea and vomiting as well as constipation 4 days. EXAM: CT ABDOMEN AND PELVIS WITH CONTRAST TECHNIQUE: Multidetector CT imaging of the abdomen and pelvis was performed using the standard protocol following bolus administration of intravenous contrast. CONTRAST:  18mL OMNIPAQUE IOHEXOL 300 MG/ML  SOLN COMPARISON:  07/10/2011 FINDINGS: Lower chest: Unremarkable. Hepatobiliary: Liver, gallbladder and biliary tree are normal. Pancreas: Normal. Spleen: Normal Adrenals/Urinary Tract: Adrenal glands are normal. Kidneys are normal in size without hydronephrosis or nephrolithiasis. Ureters and bladder are normal. Stomach/Bowel: Stomach is normal. Proximal small bowel demonstrates evidence of mild wall thickening with multiple air and fluid-filled dilated mid to distal small bowel loops measuring up to 3.5 cm in diameter. This dilated small bowel extends to the ileocecal valve. There is air and fluid-filled mildly prominent colon to the level of the mid descending colon where there is focal narrowing and abnormal soft tissue density. This may represent a constricting neoplastic process. There is air and stool within the colon distal to this focal stricture. Vascular/Lymphatic: Vascular structures are unremarkable. No adenopathy. Reproductive: Normal. Other: No significant free fluid or focal inflammatory change. Musculoskeletal: Normal. 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 There are multiple air and fluid-filled dilated small bowel loops secondary to this obstructive  process. Recommended GI consultation for endoscopic correlation and biopsy. Mild wall thickening of a jejunal loops which is nonspecific. Electronically Signed   By: Marin Olp M.D.   On: 06/26/2018 17:32     A/P: 52yo man with large bowel obstruction likely malignancy. AKI/ dehydration. Hypokalemia. Hx Alcohol and tobacco abuse  GI has been consulted for biopsy Complete metastatic w/u with 2v CXR and CEA (ordered) NG decompression (ordered) Fluid resuscitation/ correct electrolytes CIWA/ etoh withdrawal management Likely partial colectomy this admission. I discussed the findings with the patient in detail. We discussed the risks of surgery including bleeding, infection, pain, scarring, ileus, injury to intraabdominal or retroperitoneal structures, incisional hernia, acute cardiac event, pneumonia, stroke, DVT/PE, and likelihood of needing a colostomy is high. Questions welcomed and answered. He will need ongoing counseling this admission as this was more than he was ready to take in tonight.    Lanora Reveron  Kae Heller, [facility], [place] Pager [phone]

## 2018-06-21 NOTE — H&P (Signed)
History and Physical    [name]:[id number] DOB: 10-Nov-1965 DOA: 07/03/2018  PCP: Patient, No Pcp Per Patient coming from: Home  Chief Complaint: Abdominal pain, vomiting  HPI: Christopher English is a 52 y.o. male with medical history significant of alcohol abuse presenting to the hospital for evaluation of abdominal pain and vomiting.  Patient is presenting with a 3-day history of abdominal distention, nausea, vomiting, and hiccups.  He has not been able to eat.  His abdominal pain is generalized and intermittently severe.  Denies having any fevers or chills.  He drinks two 24 ounce beers per day, last drink was 4 days ago.  Denies any history of alcohol withdrawal seizures or prior hospitalizations related to withdrawal.  Patient received a total of 8 mg morphine in the ED.  He is requesting a stronger pain medication for his abdominal pain.  He denies any history of unintentional weight loss, melena, or hematochezia.  Reports having fatigue for the past few months.  He smokes 1 pack of cigarettes per day.  ED Course: Afebrile and hemodynamically stable.  No leukocytosis.  Lipase and LFTs normal.  Creatinine 1.5, previously 0.8 six yrs ago.  No recent baseline.  I-STAT troponin negative.  EKG pending.  Chest x-ray showing no active cardiopulmonary disease.  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  There are multiple air and fluid-filled dilated small bowel loops secondary to this obstructive process.  Mild wall thickening of jejunal loops which is nonspecific.  Patient received morphine, Zofran, and 1 L normal saline bolus in the ED. GI and general surgery consulted by ED provider.  Review of Systems: As per HPI otherwise 10 point review of systems negative.  Past Medical History:  Diagnosis Date  . Alcohol abuse   . No  pertinent past medical history     Past Surgical History:  Procedure Laterality Date  . NO PAST SURGERIES       reports that he has been smoking cigarettes. He has never used smokeless tobacco. He reports that he drinks alcohol. He reports that he does not use drugs.  No Known Allergies  No family history on file.  Prior to Admission medications   Not on File    Physical Exam: Vitals:   07/09/2018 2252 07/18/2018 2300 07/01/2018 2315 06/22/18 0000  BP: 120/72 (!) 142/96 (!) 156/97 (!) 174/100  Pulse: 68 74 80 72  Resp: 18 (!) 21 11 16   Temp: 98.2 F (36.8 C)   98.2 F (36.8 C)  TempSrc: Oral   Oral  SpO2: 95% 99% 97% 99%  Weight:      Height:        Physical Exam  Constitutional: He is oriented to person, place, and time. He appears well-developed and well-nourished. No distress.  HENT:  Head: Normocephalic.  Dry mucous membranes  Eyes: Right eye exhibits no discharge. Left eye exhibits no discharge.  Neck: Neck supple.  Cardiovascular: Normal rate, regular rhythm and intact distal pulses.  Pulmonary/Chest: Effort normal and breath sounds normal. No respiratory distress. He has no wheezes. He has no rales.  Abdominal: Bowel sounds are normal. He exhibits distension. There is tenderness.  Generalized tenderness to palpation  Musculoskeletal: He exhibits no edema.  Neurological: He is alert and oriented to person, place, and time.  Skin: Skin is warm and dry. He is not diaphoretic.  Labs on Admission: I have personally reviewed following labs and imaging studies  CBC: Recent Labs  Lab 07/20/2018 1511  WBC 7.0  NEUTROABS 5.5  HGB 16.9  HCT 50.2  MCV 93.5  PLT 270*   Basic Metabolic Panel: Recent Labs  Lab 07/14/2018 1511  NA 134*  K 3.2*  CL 96*  CO2 23  GLUCOSE 137*  BUN 19  CREATININE 1.54*  CALCIUM 9.8   GFR: Estimated Creatinine Clearance: 54.3 mL/min (A) (by C-G formula based on SCr of 1.54 mg/dL (H)). Liver Function Tests: Recent Labs  Lab  06/20/2018 1511  AST 25  ALT 19  ALKPHOS 70  BILITOT 0.9  PROT 8.6*  ALBUMIN 4.7   Recent Labs  Lab 07/08/2018 1511  LIPASE 20   No results for input(s): AMMONIA in the last 168 hours. Coagulation Profile: No results for input(s): INR, PROTIME in the last 168 hours. Cardiac Enzymes: No results for input(s): CKTOTAL, CKMB, CKMBINDEX, TROPONINI in the last 168 hours. BNP (last 3 results) No results for input(s): PROBNP in the last 8760 hours. HbA1C: No results for input(s): HGBA1C in the last 72 hours. CBG: No results for input(s): GLUCAP in the last 168 hours. Lipid Profile: No results for input(s): CHOL, HDL, LDLCALC, TRIG, CHOLHDL, LDLDIRECT in the last 72 hours. Thyroid Function Tests: No results for input(s): TSH, T4TOTAL, FREET4, T3FREE, THYROIDAB in the last 72 hours. Anemia Panel: No results for input(s): VITAMINB12, FOLATE, FERRITIN, TIBC, IRON, RETICCTPCT in the last 72 hours. Urine analysis:    Component Value Date/Time   COLORURINE YELLOW 06/20/2018 2315   APPEARANCEUR CLEAR 07/02/2018 2315   LABSPEC >1.046 (H) 07/05/2018 2315   PHURINE 5.0 06/29/2018 2315   GLUCOSEU NEGATIVE 07/18/2018 2315   HGBUR SMALL (A) 07/07/2018 2315   BILIRUBINUR NEGATIVE 07/02/2018 2315   KETONESUR 5 (A) 07/15/2018 2315   PROTEINUR NEGATIVE 07/03/2018 2315   UROBILINOGEN 0.2 10/26/2011 0920   NITRITE NEGATIVE 07/20/2018 2315   LEUKOCYTESUR SMALL (A) 07/18/2018 2315    Radiological Exams on Admission: Dg Chest 2 View  Result Date: 07/13/2018 CLINICAL DATA:  Colon cancer. EXAM: CHEST - 2 VIEW COMPARISON:  July 13, 2011 FINDINGS: The heart size and mediastinal contours are within normal limits. Both lungs are clear. The visualized skeletal structures are unremarkable. IMPRESSION: No active cardiopulmonary disease. Electronically Signed   By: Dorise Bullion III M.D   On: 07/10/2018 20:05   Ct Abdomen Pelvis W Contrast  Result Date: 07/17/2018 CLINICAL DATA:  Abdominal pain and  distention with nausea and vomiting as well as constipation 4 days. EXAM: CT ABDOMEN AND PELVIS WITH CONTRAST TECHNIQUE: Multidetector CT imaging of the abdomen and pelvis was performed using the standard protocol following bolus administration of intravenous contrast. CONTRAST:  176mL OMNIPAQUE IOHEXOL 300 MG/ML  SOLN COMPARISON:  07/10/2011 FINDINGS: Lower chest: Unremarkable. Hepatobiliary: Liver, gallbladder and biliary tree are normal. Pancreas: Normal. Spleen: Normal Adrenals/Urinary Tract: Adrenal glands are normal. Kidneys are normal in size without hydronephrosis or nephrolithiasis. Ureters and bladder are normal. Stomach/Bowel: Stomach is normal. Proximal small bowel demonstrates evidence of mild wall thickening with multiple air and fluid-filled dilated mid to distal small bowel loops measuring up to 3.5 cm in diameter. This dilated small bowel extends to the ileocecal valve. There is air and fluid-filled mildly prominent colon to the level of the mid descending colon where there is focal narrowing and abnormal soft tissue density. This may represent a constricting neoplastic process. There is air and stool within the colon  distal to this focal stricture. Vascular/Lymphatic: Vascular structures are unremarkable. No adenopathy. Reproductive: Normal. Other: No significant free fluid or focal inflammatory change. Musculoskeletal: Normal. 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 There are multiple air and fluid-filled dilated small bowel loops secondary to this obstructive process. Recommended GI consultation for endoscopic correlation and biopsy. Mild wall thickening of a jejunal loops which is nonspecific. Electronically Signed   By: Marin Olp M.D.   On: 06/22/2018 17:32   Dg Abd Portable 1 View  Result Date: 07/07/2018 CLINICAL DATA:  Post NG tube  placement. EXAM: PORTABLE ABDOMEN - 1 VIEW COMPARISON:  Same day CT abdomen and pelvis FINDINGS: The side port of a gastric tube is seen at the GE junction and further advancement by at least 3 cm is recommended. Dilated small and large bowel loops are identified to the mid descending colon similar in appearance to the CT findings. The diffusely dilated small bowel loops measure up to 3.9 cm in caliber slightly prominent than on CT. Given dilatation large bowel to the level of the cecum, findings are likely related to incompetence of the ileocecal valve. No free air is identified. IMPRESSION: 1. New gastric tube in place with side port at the GE junction. Further advancement of the tube is suggested by at least 3 cm. 2. Gaseous distention of small and large bowel loops as above. Electronically Signed   By: Ashley Royalty M.D.   On: 07/05/2018 23:18    EKG: Pending at this time.  Assessment/Plan Principal Problem:   Large bowel obstruction (HCC) Active Problems:   Tobacco abuse   AKI (acute kidney injury) ([place])   Hyponatremia   Thrombocytopenia (HCC)   Alcohol abuse  Large bowel obstruction likely secondary to malignancy Afebrile and no leukocytosis.  Lipase and LFTs normal. 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  There are multiple air and fluid-filled dilated small bowel loops secondary to this obstructive process.  Mild wall thickening of jejunal loops which is nonspecific. ED provider spoke to Dr. Hilarie Fredrickson from GI who will consult on the patient.  Patient seen by Dr. Kae Heller from general surgery.  He will likely undergo partial colectomy during this admission. -Dilaudid 0.5 mg every 4 hours as needed for severe pain -Metastatic work-up including 2 view chest x-ray and CEA -NGT decompression -IV fluid resuscitation -Monitor  electrolytes -Zofran PRN -N.p.o.  AKI Creatinine 1.5, previously 0.8 six yrs ago.  No recent baseline.  Likely secondary to dehydration from nausea and vomiting. -IV fluid hydration -Avoid nephrotoxic agents/contrast -BMP in a.m.  Mild hypokalemia Potassium 3.2. -Replete potassium -Check magnesium level -BMP in a.m.  Chronic thrombocytopenia Likely secondary to ethanol abuse.  Platelet count 138, stable.  Alcohol abuse Patient reports drinking two 24 ounce beers per day, last drink was 4 days ago.  No signs of withdrawal at this time. -CIWA monitoring; Ativan PRN -Thiamine, folate  Tobacco use Patient smokes a pack of cigarettes per day. -Counseled on smoking cessation.  He appears to be in the pre-contemplative stage at this time.  Refuses nicotine patch in the hospital.  DVT prophylaxis: Subcutaneous heparin.  Hold dose 8 hours before surgery. Code Status: Patient wishes to be full code. Family Communication: Family at bedside. Disposition Plan:  Anticipate discharge to home after clinical improvement. Consults called: General surgery (Dr. Kae Heller), GI (Dr. Hilarie Fredrickson) Admission status: It is my clinical opinion that admission to INPATIENT is reasonable and necessary in this 52 y.o. male . presenting with symptoms of abdominal pain/distention, nausea, vomiting, concerning for large bowel obstruction secondary to likely malignancy . in the context of PMH including: Alcohol abuse . with pertinent positives on physical exam including: Abdominal pain and distention . and pertinent positives on radiographic and laboratory data including: Electrolyte abnormalities, evidence of large bowel obstruction 2/2 mass on imaging . Workup and treatment include keeping the patient n.p.o., NG tube decompression, monitoring electrolytes, IV fluid hydration.  He will likely undergo partial colectomy during this hospitalization.  Given the aforementioned, the predictability of an adverse outcome is  felt to be significant. I expect that the patient will require at least 2 midnights in the hospital to treat this condition.    Shela Leff MD [facility] Pager [phone]  If 7PM-7AM, please contact night-coverage [email or web address] Password [facility]  06/22/2018, 3:32 AM
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
## 2018-06-21 NOTE — ED Provider Notes (Signed)
.Sedation Date/Time: 07/02/2018 11:07 PM Performed by: Deno Etienne, DO Authorized by: Deno Etienne, DO   Consent:    Consent obtained:  Verbal   Consent given by:  Patient and spouse   Risks discussed:  Dysrhythmia, allergic reaction, inadequate sedation and respiratory compromise necessitating ventilatory assistance and intubation   Alternatives discussed:  Analgesia without sedation and anxiolysis Universal protocol:    Immediately prior to procedure a time out was called: yes   Indications:    Sedation is required to allow for: NG tube placement.   Procedure necessitating sedation performed by:  Physician performing sedation Pre-sedation assessment:    Time since last food or drink:  12   ASA classification: class 2 - patient with mild systemic disease     Neck mobility: normal     Mouth opening:  2 finger widths   Thyromental distance:  3 finger widths   Mallampati score:  II - soft palate, uvula, fauces visible   Pre-sedation assessments completed and reviewed: airway patency, cardiovascular function, hydration status, mental status, nausea/vomiting, pain level, respiratory function and temperature   Immediate pre-procedure details:    Reviewed: vital signs and relevant labs/tests     Verified: bag valve mask available, emergency equipment available, intubation equipment available, IV patency confirmed, oxygen available and suction available   Procedure details (see MAR for exact dosages):    Preoxygenation:  Nasal cannula   Sedation:  Propofol   Intra-procedure monitoring:  Blood pressure monitoring, cardiac monitor, continuous capnometry, continuous pulse oximetry, frequent LOC assessments and frequent vital sign checks   Intra-procedure events: none     Total Provider sedation time (minutes):  30 Post-procedure details:    Post-sedation assessments completed and reviewed: airway patency, cardiovascular function, hydration status, mental status, nausea/vomiting, pain level,  respiratory function and temperature     Patient is stable for discharge or admission: yes     Patient tolerance:  Tolerated well, no immediate complications  NG placement Date/Time: 07/13/2018 11:16 PM Performed by: Deno Etienne, DO Authorized by: Deno Etienne, DO  Consent: Verbal consent obtained. Consent given by: patient Patient understanding: patient states understanding of the procedure being performed Patient consent: the patient's understanding of the procedure matches consent given Imaging studies: imaging studies available Time out: Immediately prior to procedure a "time out" was called to verify the correct patient, procedure, equipment, support staff and site/side marked as required. Local anesthesia used: yes  Anesthesia: Local anesthesia used: yes Local Anesthetic: topical anesthetic Anesthetic total: 4 mL  Sedation: Patient sedated: yes Sedation type: moderate (conscious) sedation Sedatives: propofol Vitals: Vital signs were monitored during sedation.  Patient tolerance: Patient tolerated the procedure well with no immediate complications   Medical screening examination/treatment/procedure(s) were conducted as a shared visit with non-physician practitioner(s) and myself.  I personally evaluated the patient during the encounter.  None   See the written copy of this report in the patient's paper medical record.  These results did not interface directly into the electronic medical record and are summarized here.  52 yo M with abdominal pain nausea vomiting constipation.  Patient found to have new cancer causing a bowel obstruction.  Not tolerating NG tube placement by nursing.  I discussed with the patient different options including waiting until tomorrow or having it done in the IR suite.  I did discuss risks of not having the NG placed.  Patient is willing to have it done as long as he is given something to help him relax.  Placed successfully  with conscious sedation.   Distended abdomen on my exam.  No significant tenderness.  Admit.    Deno Etienne, DO 07/13/2018 2318

## 2018-06-21 NOTE — ED Notes (Signed)
1st attempt to call report to 6n RN, Network engineer took number to return call in 5 min.

## 2018-06-22 ENCOUNTER — Inpatient Hospital Stay (HOSPITAL_COMMUNITY): Payer: Medicaid Other

## 2018-06-22 ENCOUNTER — Encounter (HOSPITAL_COMMUNITY): Payer: Self-pay | Admitting: General Practice

## 2018-06-22 ENCOUNTER — Other Ambulatory Visit: Payer: Self-pay

## 2018-06-22 DIAGNOSIS — N179 Acute kidney failure, unspecified: Secondary | ICD-10-CM

## 2018-06-22 DIAGNOSIS — E871 Hypo-osmolality and hyponatremia: Secondary | ICD-10-CM

## 2018-06-22 DIAGNOSIS — F1010 Alcohol abuse, uncomplicated: Secondary | ICD-10-CM

## 2018-06-22 DIAGNOSIS — D696 Thrombocytopenia, unspecified: Secondary | ICD-10-CM

## 2018-06-22 DIAGNOSIS — R935 Abnormal findings on diagnostic imaging of other abdominal regions, including retroperitoneum: Secondary | ICD-10-CM

## 2018-06-22 LAB — URINALYSIS, ROUTINE W REFLEX MICROSCOPIC
BACTERIA UA: NONE SEEN
BILIRUBIN URINE: NEGATIVE
GLUCOSE, UA: NEGATIVE mg/dL
Ketones, ur: 5 mg/dL — AB
NITRITE: NEGATIVE
PROTEIN: NEGATIVE mg/dL
pH: 5 (ref 5.0–8.0)

## 2018-06-22 LAB — BASIC METABOLIC PANEL
Anion gap: 13 (ref 5–15)
BUN: 15 mg/dL (ref 6–20)
CALCIUM: 9 mg/dL (ref 8.9–10.3)
CO2: 21 mmol/L — AB (ref 22–32)
Chloride: 101 mmol/L (ref 98–111)
Creatinine, Ser: 1.06 mg/dL (ref 0.61–1.24)
GFR calc Af Amer: >60 mL/min (ref >60)
GFR calc non Af Amer: >60 mL/min (ref >60)
Glucose, Bld: 124 mg/dL — ABNORMAL HIGH (ref 70–99)
Potassium: 4.7 mmol/L (ref 3.5–5.1)
Sodium: 135 mmol/L (ref 135–145)

## 2018-06-22 LAB — MAGNESIUM: Magnesium: 2.5 mg/dL — ABNORMAL HIGH (ref 1.7–2.4)

## 2018-06-22 LAB — HIV ANTIBODY (ROUTINE TESTING W REFLEX): HIV Screen 4th Generation wRfx: NONREACTIVE

## 2018-06-22 LAB — PHOSPHORUS: Phosphorus: 3.5 mg/dL (ref 2.5–4.6)

## 2018-06-22 MED ORDER — SODIUM CHLORIDE 0.9 % IV SOLN
INTRAVENOUS | Status: DC
  Administered 2018-06-22 – 2018-06-24 (×5): via INTRAVENOUS

## 2018-06-22 MED ORDER — ONDANSETRON HCL 4 MG/2ML IJ SOLN
4.0000 mg | Freq: Once | INTRAMUSCULAR | Status: AC
  Administered 2018-06-22: 4 mg via INTRAVENOUS

## 2018-06-22 MED ORDER — PHENOL 1.4 % MT LIQD
1.0000 | OROMUCOSAL | Status: DC | PRN
  Administered 2018-06-22: 1 via OROMUCOSAL
  Filled 2018-06-22: qty 177

## 2018-06-22 MED ORDER — HEPARIN SODIUM (PORCINE) 5000 UNIT/ML IJ SOLN
5000.0000 [IU] | Freq: Three times a day (TID) | INTRAMUSCULAR | Status: DC
  Administered 2018-06-22 – 2018-06-24 (×6): 5000 [IU] via SUBCUTANEOUS
  Filled 2018-06-22 (×6): qty 1

## 2018-06-22 MED ORDER — LORAZEPAM 2 MG/ML IJ SOLN
0.5000 mg | Freq: Once | INTRAMUSCULAR | Status: DC
  Filled 2018-06-22: qty 1

## 2018-06-22 MED ORDER — FOLIC ACID 5 MG/ML IJ SOLN
1.0000 mg | Freq: Every day | INTRAMUSCULAR | Status: DC
  Administered 2018-06-22 – 2018-06-24 (×3): 1 mg via INTRAVENOUS
  Filled 2018-06-22 (×4): qty 0.2

## 2018-06-22 MED ORDER — LIDOCAINE VISCOUS HCL 2 % MT SOLN
10.0000 mL | Freq: Four times a day (QID) | OROMUCOSAL | Status: DC | PRN
  Filled 2018-06-22: qty 15

## 2018-06-22 MED ORDER — LORAZEPAM 2 MG/ML IJ SOLN
1.0000 mg | Freq: Once | INTRAMUSCULAR | Status: AC
  Administered 2018-06-22: 1 mg via INTRAVENOUS
  Filled 2018-06-22: qty 1

## 2018-06-22 MED ORDER — DEXTROSE IN LACTATED RINGERS 5 % IV SOLN
INTRAVENOUS | Status: DC
  Administered 2018-06-22: 04:00:00 via INTRAVENOUS

## 2018-06-22 NOTE — Progress Notes (Signed)
KHALEL ALMS is a 52 y.o. male patient admitted from ED awake, alert - oriented  X 4 - no acute distress noted.  VSS - Blood pressure (!) 174/100, pulse 72, temperature 98.2 F (36.8 C), temperature source Oral, resp. rate 16, height 5\' 8"  (1.727 m), weight 69 kg, SpO2 99 %.    IV in place, occlusive dsg intact without redness.    Will cont to eval and treat per MD orders.  Vidal Schwalbe, RN 06/22/2018 12:29 AM

## 2018-06-22 NOTE — Progress Notes (Signed)
[facility]/Trauma Progress Note      Assessment/Plan Principal Problem:   Large bowel obstruction (HCC) Active Problems:   Tobacco abuse   AKI (acute kidney injury) ([place])   Hyponatremia   Thrombocytopenia (HCC)   Alcohol abuse  Obstructing descending colon mass - GI consult pending - CEA pending - pt will likely need a partial colectomy this admission with likely colostomy. We will await GI recommendations prior to scheduling. We will follow  FEN: NGT, NPO, IVF VTE: SCD's, heparin ID: no abx Foley: none Follow up: TBD    LOS: 1 day    Subjective: CC: NGT is irritating  No abdominal pain. Pt denies flatus or BM since admission. Mother at bedside. NGT has been dislodged and I have asked nurse to advance it. Pt denies recent weight loss.   Objective: Vital signs in last 24 hours: Temp:  [98.2 F (36.8 C)-98.5 F (36.9 C)] 98.5 F (36.9 C) (12/03 0535) Pulse Rate:  [68-86] 82 (12/03 0535) Resp:  [11-21] 16 (12/03 0535) BP: (120-174)/(72-102) 144/90 (12/03 0535) SpO2:  [95 %-100 %] 97 % (12/03 0535) Weight:  [69 kg] 69 kg (12/02 2237) Last BM Date: 06/17/18  Intake/Output from previous day: 12/02 0701 - 12/03 0700 In: 2641.1 [I.V.:755.4; IV Piggyback:1885.7] Out: 900 [Urine:200; Emesis/NG output:700] Intake/Output this shift: No intake/output data recorded.  PE: Gen:  Alert, NAD, pleasant, cooperative Pulm:  Rate and effort normal Abd: Soft, NT/ND, +BS Skin: no rashes noted, warm and dry   Anti-infectives: Anti-infectives (From admission, onward)   None      Lab Results:  Recent Labs    07/14/2018 1511  WBC 7.0  HGB 16.9  HCT 50.2  PLT 138*   BMET Recent Labs    06/20/2018 1511 06/22/18 0212  NA 134* 135  K 3.2* 4.7  CL 96* 101  CO2 23 21*  GLUCOSE 137* 124*  BUN 19 15  CREATININE 1.54* 1.06  CALCIUM 9.8 9.0   PT/INR No results for input(s): LABPROT, INR in the last 72 hours. CMP     Component Value Date/Time   NA  135 06/22/2018 0212   K 4.7 06/22/2018 0212   CL 101 06/22/2018 0212   CO2 21 (L) 06/22/2018 0212   GLUCOSE 124 (H) 06/22/2018 0212   BUN 15 06/22/2018 0212   CREATININE 1.06 06/22/2018 0212   CALCIUM 9.0 06/22/2018 0212   PROT 8.6 (H) 07/02/2018 1511   ALBUMIN 4.7 06/30/2018 1511   AST 25 07/06/2018 1511   ALT 19 07/13/2018 1511   ALKPHOS 70 07/12/2018 1511   BILITOT 0.9 07/20/2018 1511   GFRNONAA >60 06/22/2018 0212   GFRAA >60 06/22/2018 0212   Lipase     Component Value Date/Time   LIPASE 20 07/14/2018 1511    Studies/Results: Dg Chest 2 View  Result Date: 06/28/2018 CLINICAL DATA:  Colon cancer. EXAM: CHEST - 2 VIEW COMPARISON:  July 13, 2011 FINDINGS: The heart size and mediastinal contours are within normal limits. Both lungs are clear. The visualized skeletal structures are unremarkable. IMPRESSION: No active cardiopulmonary disease. Electronically Signed   By: Dorise Bullion III M.D   On: 07/19/2018 20:05   Ct Abdomen Pelvis W Contrast  Result Date: 07/15/2018 CLINICAL DATA:  Abdominal pain and distention with nausea and vomiting as well as constipation 4 days. EXAM: CT ABDOMEN AND PELVIS WITH CONTRAST TECHNIQUE: Multidetector CT imaging of the abdomen and pelvis was performed using the standard protocol following bolus administration of intravenous contrast. CONTRAST:  150mL OMNIPAQUE IOHEXOL 300 MG/ML  SOLN COMPARISON:  07/10/2011 FINDINGS: Lower chest: Unremarkable. Hepatobiliary: Liver, gallbladder and biliary tree are normal. Pancreas: Normal. Spleen: Normal Adrenals/Urinary Tract: Adrenal glands are normal. Kidneys are normal in size without hydronephrosis or nephrolithiasis. Ureters and bladder are normal. Stomach/Bowel: Stomach is normal. Proximal small bowel demonstrates evidence of mild wall thickening with multiple air and fluid-filled dilated mid to distal small bowel loops measuring up to 3.5 cm in diameter. This dilated small bowel extends to the ileocecal  valve. There is air and fluid-filled mildly prominent colon to the level of the mid descending colon where there is focal narrowing and abnormal soft tissue density. This may represent a constricting neoplastic process. There is air and stool within the colon distal to this focal stricture. Vascular/Lymphatic: Vascular structures are unremarkable. No adenopathy. Reproductive: Normal. Other: No significant free fluid or focal inflammatory change. Musculoskeletal: Normal. 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 There are multiple air and fluid-filled dilated small bowel loops secondary to this obstructive process. Recommended GI consultation for endoscopic correlation and biopsy. Mild wall thickening of a jejunal loops which is nonspecific. Electronically Signed   By: Marin Olp M.D.   On: 07/07/2018 17:32   Dg Abd Portable 1 View  Result Date: 07/16/2018 CLINICAL DATA:  Post NG tube placement. EXAM: PORTABLE ABDOMEN - 1 VIEW COMPARISON:  Same day CT abdomen and pelvis FINDINGS: The side port of a gastric tube is seen at the GE junction and further advancement by at least 3 cm is recommended. Dilated small and large bowel loops are identified to the mid descending colon similar in appearance to the CT findings. The diffusely dilated small bowel loops measure up to 3.9 cm in caliber slightly prominent than on CT. Given dilatation large bowel to the level of the cecum, findings are likely related to incompetence of the ileocecal valve. No free air is identified. IMPRESSION: 1. New gastric tube in place with side port at the GE junction. Further advancement of the tube is suggested by at least 3 cm. 2. Gaseous distention of small and large bowel loops as above. Electronically Signed   By: Ashley Royalty M.D.   On: 06/20/2018 23:18      Kalman Drape , [facility] 06/22/2018, 10:08 AM  Pager: [phone] Mon-Wed, Friday 7:00am-4:30pm Thurs 7am-11:30am  Consults: [phone]

## 2018-06-22 NOTE — Progress Notes (Addendum)
Blanchard Mane, RN attempted to reposition NGT per abdominal xray result from post NGT insertion but unsuccessful x 2 and patient anxious.  Requested for 1 time dose Ativan IV but later found out that patient removed the NG tube out and wants that a doctor or IR will place the NGT tomorrow with sedation.  Explained to patient that Ativan Inj can be given to calm him down prior to reinsertion of the NGT but patient refused and wanted somebody downstairs to place it just like what they did during the 1st NGT placement.  Reiterated that patient will experience nausea and likely vomiting because of the absence of NGT and patient understood that and will wait for tomorrow.  Will monitor and advise on-call [facility] floor coverage and Gen. surgery MDs accordingly.

## 2018-06-22 NOTE — Progress Notes (Addendum)
Patient agreed to NGT due to vomiting. Dr. Broadus John paged- ordered 1mg  Ativan prior to insertion. Patient tolerated. Awaiting Xray results to confirm placement.

## 2018-06-22 NOTE — Consult Note (Addendum)
[facility] Consult: 8:47 AM 06/22/2018  LOS: 1 day    Referring Provider: Dr Broadus John  Primary Care Physician:  Patient, No Pcp Per Primary Gastroenterologist:  unassigned   Reason for Consultation:  Colon mass.     HPI: Christopher English is a 52 y.o. male.  PMH unremrkable.  ETOH abuse.     Admitted yesterday from ED with 4 d abdominal pain, distention, vomiting of liquid and solid p.o. intake, hiccups.  Over the last several months he has had early satiety.  Intermittent bouts abdominal pain, distention and sometimes vomiting intermittently over the last several months.  Has not had a bowel movement since Friday.  However prior to Friday he says he was having regular, formed stools.  Has not seen blood with his bowel movements. 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 There are multiple air and fluid-filled dilated small bowel loops secondary to this obstructive process. Recommended GI consultation for endoscopic correlation and biopsy.  Mild wall thickening of a jejunal loops which is nonspecific.  Labs with BUN 1.5 >> 1.0, potassium 3.2 >> 4.7 overnight.  Glucose 120s-130s.  LFTs, Hgb, WBCs ok.   NGT for decompression required propofol sedation for placement.  It put out a lot of fluid initially the total output was 700 mL's.  However it fell versus was pulled out and needs to be replaced. Dr Kae Heller from gen surg has consulted.  Anticipates partial colectomy this admission. CEA ordered.    No Fm Hx colon cancer.  Pt drinks 48 oz beer per day.  Smoker.   Works as Patent attorney.        Past Medical History:  Diagnosis Date  . Alcohol abuse   . No  pertinent past medical history     Past Surgical History:  Procedure Laterality Date  . NO PAST SURGERIES      Prior to Admission medications   Not on File    Scheduled Meds: . folic acid  1 mg Intravenous Daily  . heparin  5,000 Units Subcutaneous Q8H  . lidocaine (PF)      . propofol      . thiamine  100 mg Oral Daily   Or  . thiamine  100 mg Intravenous Daily   Infusions: . sodium chloride     PRN Meds: HYDROmorphone (DILAUDID) injection, LORazepam **OR** LORazepam, ondansetron (ZOFRAN) IV, phenol   Allergies as of 07/14/2018  . (No Known Allergies)    No family history on file.  Social History   Socioeconomic History  . Marital status: Single    Spouse name: Not on file  . Number of children: Not on file  . Years of education: Not on file  . Highest education level: Not on file  Occupational History  . Not on file  Social Needs  . Financial resource strain: Not on file  . Food insecurity:    Worry: Not on file  Inability: Not on file  . Transportation needs:    Medical: Not on file    Non-medical: Not on file  Tobacco Use  . Smoking status: Current Every Day Smoker    Types: Cigarettes  . Smokeless tobacco: Never Used  Substance and Sexual Activity  . Alcohol use: Yes    Comment: 07/11/11 "he consumes alot of beer"/mother  . Drug use: No  . Sexual activity: Yes  Lifestyle  . Physical activity:    Days per week: Not on file    Minutes per session: Not on file  . Stress: Not on file  Relationships  . Social connections:    Talks on phone: Not on file    Gets together: Not on file    Attends religious service: Not on file    Active member of club or organization: Not on file    Attends meetings of clubs or organizations: Not on file    Relationship status: Not on file  . Intimate partner violence:    Fear of current or ex partner: Not on file    Emotionally abused: Not on file    Physically abused: Not on file    Forced sexual activity:  Not on file  Other Topics Concern  . Not on file  Social History Narrative  . Not on file    REVIEW OF SYSTEMS: Constitutional: Feels somewhat weak having gone 4 days without significant p.o. intake. ENT:  No nose bleeds Pulm: Denies cough or shortness of breath. CV:  No palpitations, no LE edema.  No chest pain GU:  No hematuria, no frequency, no oliguria. GI: Per HPI Heme: Denies excessive or unusual bleeding or bruising. Transfusions: None. Neuro:  No headaches, no peripheral tingling or numbness.  Syncope.  No seizures. Derm:  No itching, no rash or sores.  Endocrine:  No sweats or chills.  No polyuria or dysuria Immunization: Did not inquire but he has not seen a doctor in years so he is certainly not up-to-date on flu or pneumococcal vaccination. Travel:  None beyond local counties in last few months.    PHYSICAL EXAM: Vital signs in last 24 hours: Vitals:   06/22/18 0000 06/22/18 0535  BP: (!) 174/100 (!) 144/90  Pulse: 72 82  Resp: 16 16  Temp: 98.2 F (36.8 C) 98.5 F (36.9 C)  SpO2: 99% 97%   Wt Readings from Last 3 Encounters:  06/28/2018 69 kg  10/26/11 68.9 kg    General: Somewhat thin, slightly anxious, slightly unwell appearing AAM Head: No signs of trauma.  No facial asymmetry or swelling. Eyes: No conjunctival pallor.  No scleral icterus.  EOMI. Ears: Not hard of hearing Nose: No congestion or discharge Mouth: Some missing teeth.  Fair dentition.  Oral mucosa moist, pink, clear.  Tongue midline. Neck: No masses, no thyromegaly, no JVD. Lungs: Clear bilaterally.  No labored breathing, no cough. Heart: RRR.  No MRG.  S1, S2 present Abdomen: Tense, distended.  Scant bowel sounds but no tinkling or tympanitic sounds.  Not tender.  No hernias or bruits.  No organomegaly or masses appreciated..   Rectal: DRE deferred. Musc/Skeltl: No joint swelling or gross deformity. Extremities: CCE. Neurologic: Oriented x3.  Alert.  Moves all 4 limbs, full strength.   No tremors. Skin: No rashes, sores, suspicious lesions. Nodes: No cervical adenopathy Psych: Cooperative, anxious.  Fluid speech.  Intake/Output from previous day: 12/02 0701 - 12/03 0700 In: 2641.1 [I.V.:755.4; IV Piggyback:1885.7] Out: 900 [Urine:200; Emesis/NG output:700] Intake/Output this  shift: No intake/output data recorded.  LAB RESULTS: Recent Labs    06/26/2018 1511  WBC 7.0  HGB 16.9  HCT 50.2  PLT 138*   BMET Lab Results  Component Value Date   NA 135 06/22/2018   NA 134 (L) 07/09/2018   NA 141 10/26/2011   K 4.7 06/22/2018   K 3.2 (L) 07/16/2018   K 3.9 10/26/2011   CL 101 06/22/2018   CL 96 (L) 07/05/2018   CL 102 10/26/2011   CO2 21 (L) 06/22/2018   CO2 23 07/15/2018   CO2 25 10/26/2011   GLUCOSE 124 (H) 06/22/2018   GLUCOSE 137 (H) 06/28/2018   GLUCOSE 88 10/26/2011   BUN 15 06/22/2018   BUN 19 06/20/2018   BUN 9 10/26/2011   CREATININE 1.06 06/22/2018   CREATININE 1.54 (H) 07/16/2018   CREATININE 0.89 10/26/2011   CALCIUM 9.0 06/22/2018   CALCIUM 9.8 06/22/2018   CALCIUM 9.4 10/26/2011   LFT Recent Labs    07/19/2018 1511  PROT 8.6*  ALBUMIN 4.7  AST 25  ALT 19  ALKPHOS 70  BILITOT 0.9   PT/INR Lab Results  Component Value Date   INR 0.99 07/11/2011   INR 1.04 07/10/2011   Hepatitis Panel No results for input(s): HEPBSAG, HCVAB, HEPAIGM, HEPBIGM in the last 72 hours. C-Diff No components found for: CDIFF Lipase     Component Value Date/Time   LIPASE 20 06/22/2018 1511    Drugs of Abuse     Component Value Date/Time   LABOPIA NONE DETECTED 10/26/2011 0920   COCAINSCRNUR NONE DETECTED 10/26/2011 0920   LABBENZ POSITIVE (A) 10/26/2011 0920   AMPHETMU NONE DETECTED 10/26/2011 0920   THCU POSITIVE (A) 10/26/2011 0920   LABBARB NONE DETECTED 10/26/2011 0920     RADIOLOGY STUDIES: Dg Chest 2 View  Result Date: 06/29/2018 CLINICAL DATA:  Colon cancer. EXAM: CHEST - 2 VIEW COMPARISON:  July 13, 2011 FINDINGS: The heart  size and mediastinal contours are within normal limits. Both lungs are clear. The visualized skeletal structures are unremarkable. IMPRESSION: No active cardiopulmonary disease. Electronically Signed   By: Dorise Bullion III M.D   On: 07/03/2018 20:05   Ct Abdomen Pelvis W Contrast  Result Date: 07/12/2018 CLINICAL DATA:  Abdominal pain and distention with nausea and vomiting as well as constipation 4 days. EXAM: CT ABDOMEN AND PELVIS WITH CONTRAST TECHNIQUE: Multidetector CT imaging of the abdomen and pelvis was performed using the standard protocol following bolus administration of intravenous contrast. CONTRAST:  174mL OMNIPAQUE IOHEXOL 300 MG/ML  SOLN COMPARISON:  07/10/2011 FINDINGS: Lower chest: Unremarkable. Hepatobiliary: Liver, gallbladder and biliary tree are normal. Pancreas: Normal. Spleen: Normal Adrenals/Urinary Tract: Adrenal glands are normal. Kidneys are normal in size without hydronephrosis or nephrolithiasis. Ureters and bladder are normal. Stomach/Bowel: Stomach is normal. Proximal small bowel demonstrates evidence of mild wall thickening with multiple air and fluid-filled dilated mid to distal small bowel loops measuring up to 3.5 cm in diameter. This dilated small bowel extends to the ileocecal valve. There is air and fluid-filled mildly prominent colon to the level of the mid descending colon where there is focal narrowing and abnormal soft tissue density. This may represent a constricting neoplastic process. There is air and stool within the colon distal to this focal stricture. Vascular/Lymphatic: Vascular structures are unremarkable. No adenopathy. Reproductive: Normal. Other: No significant free fluid or focal inflammatory change. Musculoskeletal: Normal. 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 There are multiple air and fluid-filled  dilated small bowel loops secondary to this obstructive process. Recommended GI consultation for endoscopic correlation and biopsy. Mild wall thickening of a jejunal loops which is nonspecific. Electronically Signed   By: Marin Olp M.D.   On: 07/02/2018 17:32   Dg Abd Portable 1 View  Result Date: 07/13/2018 CLINICAL DATA:  Post NG tube placement. EXAM: PORTABLE ABDOMEN - 1 VIEW COMPARISON:  Same day CT abdomen and pelvis FINDINGS: The side port of a gastric tube is seen at the GE junction and further advancement by at least 3 cm is recommended. Dilated small and large bowel loops are identified to the mid descending colon similar in appearance to the CT findings. The diffusely dilated small bowel loops measure up to 3.9 cm in caliber slightly prominent than on CT. Given dilatation large bowel to the level of the cecum, findings are likely related to incompetence of the ileocecal valve. No free air is identified. IMPRESSION: 1. New gastric tube in place with side port at the GE junction. Further advancement of the tube is suggested by at least 3 cm. 2. Gaseous distention of small and large bowel loops as above. Electronically Signed   By: Ashley Royalty M.D.   On: 07/12/2018 23:18     IMPRESSION:   *  Obstructing colon mass.   Chest x-ray negative for liver mets.  CT scan negative for liver mets. NG tube successfully evacuated 700 mL's of gastric contents, is now out and needs to be replaced. It is unlikely were going to be able to complete an oral bowel prep given the patient's level of obstructive nausea and vomiting.  Since the masses in the mid descending colon, could perform flexible sigmoidoscopy using large volume enema as the "prep".  *  Mild AKI, dehydration and hypokalemia, now corrected.      PLAN:     *   Dr. Henrene Pastor will be seeing the patient and make decision regarding endoscopic evaluation.  Azucena Freed  06/22/2018, 8:47 AM Phone [phone]  GI ATTENDING  History,  laboratories, x-rays reviewed.  Patient personally seen and examined.  Agree with comprehensive consultation note as outlined above.  The patient presents with progressive obstructive symptoms culminating with acute pain, nausea, and vomiting.  X-rays reveal mass lesion of the descending colon with obstruction.  The patient has pulled out his NG tube.  His abdomen is not tender this afternoon but clearly distended with active bowel sounds. IMPRESSION: 1.  Obstructing lesion of left colon.  Likely malignant but could be benign chronic inflammatory process.  Regardless, this patient needs surgery.  Colonoscopy in this setting adds little if any value in terms of management, but does carry increased risk of perforation.  I recommend surgical management as the next step.  Please call for questions or problems.  Discussed with patient.  Will sign off.  Docia Chuck. Geri Seminole., M.D. [facility] Division of Gastroenterology

## 2018-06-22 NOTE — Progress Notes (Signed)
Brigid Re, PA notified that NG tube fell out of patient's nare.

## 2018-06-22 NOTE — Care Management Note (Signed)
Case Management Note  Patient Details  Name: Christopher English MRN: [id number] Date of Birth: February 09, 1966  Subjective/Objective:                    Action/Plan:  Plan for  with partial colectomy and colostomy this week.Will continue to follow. Expected Discharge Date:                  Expected Discharge Plan:  [place]  In-House Referral:  Financial Counselor  Discharge planning Services  CM Consult, [facility], [facility]  Post Acute Care Choice:    Choice offered to:     DME Arranged:    DME Agency:     HH Arranged:    HH Agency:     Status of Service:  In process, will continue to follow  If discussed at Long Length of Stay Meetings, dates discussed:    Additional Comments:  Marilu Favre, RN 06/22/2018, 3:51 PM

## 2018-06-22 NOTE — Progress Notes (Signed)
PROGRESS NOTE    ONOFRIO KLEMP  IOE:[id number] DOB: 1966/04/28 DOA: 07/18/2018 PCP: Patient, No Pcp Per  Brief Narrative: 52 year old male with alcohol abuse presented to the emergency room yesterday with abdominal pain distention vomiting.  CT abdomen showed fluid and air-filled prominent colon to the level of mid descending colon with irregular mass causing moderate focal stricture, also multiple air-fluid levels with dilated small bowel loops noted.  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 Code Status: full code. Family Communication: Family at bedside. Disposition Plan: Anticipate discharge to home after clinical improvement.  Consultants:   GI, general surgery   Procedures:   Antimicrobials:    Subjective: -Complains of discomfort caused by NG tube   Objective: Vitals:   06/20/2018 2300 06/22/2018 2315 06/22/18 0000 06/22/18 0535  BP: (!) 142/96 (!) 156/97 (!) 174/100 (!) 144/90  Pulse: 74 80 72 82  Resp: (!) 21 11 16 16   Temp:   98.2 F (36.8 C) 98.5 F (36.9 C)  TempSrc:   Oral Oral  SpO2: 99% 97% 99% 97%  Weight:      Height:        Intake/Output Summary (Last 24 hours) at 06/22/2018 1154 Last data filed at 06/22/2018 1140 Gross per 24 hour  Intake 2641.06 ml  Output 940 ml  Net 1701.06 ml   Filed Weights   07/19/2018 2237  Weight: 69 kg    Examination:  General exam: Appears calm and  comfortable, honestly ill-appearing, sitting up in bed, NG tube to intermittent suction Respiratory system: Good air movement, decreased breath sounds at both bases Cardiovascular system: S1 & S2 heard, RRR. Gastrointestinal system: Abdomen is tense, nontender, distended, bowel sounds increased Central nervous system: Alert and oriented. No focal neurological deficits. Extremities: no Edema Skin: No rashes, lesions or ulcers Psychiatry: Judgement and insight appear normal. Mood & affect appropriate.     Data Reviewed:   CBC: Recent Labs  Lab 07/10/2018 1511  WBC 7.0  NEUTROABS 5.5  HGB 16.9  HCT 50.2  MCV 93.5  PLT 182*   Basic Metabolic Panel: Recent Labs  Lab 07/18/2018 1511 06/22/18 0212  NA 134* 135  K 3.2* 4.7  CL 96* 101  CO2 23 21*  GLUCOSE 137* 124*  BUN 19 15  CREATININE 1.54* 1.06  CALCIUM 9.8 9.0  MG  --  2.5*  PHOS  --  3.5   GFR: Estimated Creatinine Clearance: 78.9 mL/min (by C-G formula based on SCr of 1.06 mg/dL). Liver Function Tests: Recent Labs  Lab 07/01/2018 1511  AST 25  ALT 19  ALKPHOS 70  BILITOT 0.9  PROT 8.6*  ALBUMIN 4.7   Recent Labs  Lab 07/03/2018 1511  LIPASE 20   No results for input(s): AMMONIA in the last 168 hours. Coagulation Profile: No results for input(s): INR, PROTIME in the last 168 hours. Cardiac Enzymes: No results for input(s): CKTOTAL, CKMB, CKMBINDEX, TROPONINI in the last 168 hours. BNP (last 3 results) No results for input(s):  PROBNP in the last 8760 hours. HbA1C: No results for input(s): HGBA1C in the last 72 hours. CBG: No results for input(s): GLUCAP in the last 168 hours. Lipid Profile: No results for input(s): CHOL, HDL, LDLCALC, TRIG, CHOLHDL, LDLDIRECT in the last 72 hours. Thyroid Function Tests: No results for input(s): TSH, T4TOTAL, FREET4, T3FREE, THYROIDAB in the last 72 hours. Anemia Panel: No results for input(s): VITAMINB12, FOLATE, FERRITIN, TIBC, IRON, RETICCTPCT in the last 72  hours. Urine analysis:    Component Value Date/Time   COLORURINE YELLOW 06/23/2018 2315   APPEARANCEUR CLEAR 06/28/2018 2315   LABSPEC >1.046 (H) 07/09/2018 2315   PHURINE 5.0 07/04/2018 2315   GLUCOSEU NEGATIVE 07/20/2018 2315   HGBUR SMALL (A) 06/29/2018 2315   BILIRUBINUR NEGATIVE 06/20/2018 2315   KETONESUR 5 (A) 07/09/2018 2315   PROTEINUR NEGATIVE 07/03/2018 2315   UROBILINOGEN 0.2 10/26/2011 0920   NITRITE NEGATIVE 07/16/2018 2315   LEUKOCYTESUR SMALL (A) 07/06/2018 2315   Sepsis Labs: @LABRCNTIP (procalcitonin:4,lacticidven:4)  )No results found for this or any previous visit (from the past 240 hour(s)).       Radiology Studies: Dg Chest 2 View  Result Date: 07/01/2018 CLINICAL DATA:  Colon cancer. EXAM: CHEST - 2 VIEW COMPARISON:  July 13, 2011 FINDINGS: The heart size and mediastinal contours are within normal limits. Both lungs are clear. The visualized skeletal structures are unremarkable. IMPRESSION: No active cardiopulmonary disease. Electronically Signed   By: Dorise Bullion III M.D   On: 06/28/2018 20:05   Ct Abdomen Pelvis W Contrast  Result Date: 07/08/2018 CLINICAL DATA:  Abdominal pain and distention with nausea and vomiting as well as constipation 4 days. EXAM: CT ABDOMEN AND PELVIS WITH CONTRAST TECHNIQUE: Multidetector CT imaging of the abdomen and pelvis was performed using the standard protocol following bolus administration of intravenous contrast. CONTRAST:  170mL OMNIPAQUE IOHEXOL 300 MG/ML  SOLN COMPARISON:  07/10/2011 FINDINGS: Lower chest: Unremarkable. Hepatobiliary: Liver, gallbladder and biliary tree are normal. Pancreas: Normal. Spleen: Normal Adrenals/Urinary Tract: Adrenal glands are normal. Kidneys are normal in size without hydronephrosis or nephrolithiasis. Ureters and bladder are normal. Stomach/Bowel: Stomach is normal. Proximal small bowel demonstrates evidence of mild wall thickening with multiple air and fluid-filled dilated mid to  distal small bowel loops measuring up to 3.5 cm in diameter. This dilated small bowel extends to the ileocecal valve. There is air and fluid-filled mildly prominent colon to the level of the mid descending colon where there is focal narrowing and abnormal soft tissue density. This may represent a constricting neoplastic process. There is air and stool within the colon distal to this focal stricture. Vascular/Lymphatic: Vascular structures are unremarkable. No adenopathy. Reproductive: Normal. Other: No significant free fluid or focal inflammatory change. Musculoskeletal: Normal. 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 There are multiple air and fluid-filled dilated small bowel loops secondary to this obstructive process. Recommended GI consultation for endoscopic correlation and biopsy. Mild wall thickening of a jejunal loops which is nonspecific. Electronically Signed   By: Marin Olp M.D.   On: 07/17/2018 17:32   Dg Abd Portable 1 View  Result Date: 06/25/2018 CLINICAL DATA:  Post NG tube placement. EXAM: PORTABLE ABDOMEN - 1 VIEW COMPARISON:  Same day CT abdomen and pelvis FINDINGS: The side port of a gastric tube is seen at the GE junction and further advancement by at least 3 cm is recommended. Dilated  small and large bowel loops are identified to the mid descending colon similar in appearance to the CT findings. The diffusely dilated small bowel loops measure up to 3.9 cm in caliber slightly prominent than on CT. Given dilatation large bowel to the level of the cecum, findings are likely related to incompetence of the ileocecal valve. No free air is identified. IMPRESSION: 1. New gastric tube in place with side port at the GE junction. Further advancement of the tube is suggested by at least 3 cm. 2. Gaseous distention of small and large bowel loops as  above. Electronically Signed   By: Ashley Royalty M.D.   On: 07/04/2018 23:18        Scheduled Meds: . folic acid  1 mg Intravenous Daily  . heparin  5,000 Units Subcutaneous Q8H  . thiamine  100 mg Oral Daily   Or  . thiamine  100 mg Intravenous Daily   Continuous Infusions: . sodium chloride 100 mL/hr at 06/22/18 0907     LOS: 1 day    Time spent: 35min    Domenic Polite, MD [facility] Page via [email or web address], password [email or web address] After 7PM please contact night-coverage  06/22/2018, 11:54 AM

## 2018-06-22 NOTE — Progress Notes (Signed)
Patient is refusing for NG tube to be inserted at bedside. He states he has to be sedated for an NG tube to be placed. Janett Billow, [place] notified.

## 2018-06-23 ENCOUNTER — Inpatient Hospital Stay (HOSPITAL_COMMUNITY): Payer: Medicaid Other

## 2018-06-23 DIAGNOSIS — R6521 Severe sepsis with septic shock: Secondary | ICD-10-CM

## 2018-06-23 DIAGNOSIS — Z7189 Other specified counseling: Secondary | ICD-10-CM

## 2018-06-23 DIAGNOSIS — Z515 Encounter for palliative care: Secondary | ICD-10-CM

## 2018-06-23 DIAGNOSIS — A419 Sepsis, unspecified organism: Secondary | ICD-10-CM

## 2018-06-23 DIAGNOSIS — I469 Cardiac arrest, cause unspecified: Secondary | ICD-10-CM

## 2018-06-23 DIAGNOSIS — R579 Shock, unspecified: Secondary | ICD-10-CM

## 2018-06-23 DIAGNOSIS — J690 Pneumonitis due to inhalation of food and vomit: Secondary | ICD-10-CM

## 2018-06-23 DIAGNOSIS — K56609 Unspecified intestinal obstruction, unspecified as to partial versus complete obstruction: Secondary | ICD-10-CM

## 2018-06-23 DIAGNOSIS — J9601 Acute respiratory failure with hypoxia: Secondary | ICD-10-CM

## 2018-06-23 LAB — BLOOD GAS, ARTERIAL
Acid-base deficit: 12.5 mmol/L — ABNORMAL HIGH (ref 0.0–2.0)
Bicarbonate: 13 mmol/L — ABNORMAL LOW (ref 20.0–28.0)
Drawn by: 441661
O2 Content: 15 L/min
O2 Saturation: 98.2 %
Patient temperature: 98.6
pCO2 arterial: 28.5 mmHg — ABNORMAL LOW (ref 32.0–48.0)
pH, Arterial: 7.28 — ABNORMAL LOW (ref 7.350–7.450)
pO2, Arterial: 136 mmHg — ABNORMAL HIGH (ref 83.0–108.0)

## 2018-06-23 LAB — URINALYSIS, ROUTINE W REFLEX MICROSCOPIC
Bilirubin Urine: NEGATIVE
Glucose, UA: NEGATIVE mg/dL
Ketones, ur: NEGATIVE mg/dL
Leukocytes, UA: NEGATIVE
Nitrite: NEGATIVE
PROTEIN: NEGATIVE mg/dL
Specific Gravity, Urine: >1.046 — ABNORMAL HIGH (ref 1.005–1.030)
pH: 5 (ref 5.0–8.0)

## 2018-06-23 LAB — CBC
HCT: 46.8 % (ref 39.0–52.0)
Hemoglobin: 14.9 g/dL (ref 13.0–17.0)
MCH: 31.8 pg (ref 26.0–34.0)
MCHC: 31.8 g/dL (ref 30.0–36.0)
MCV: 100 fL (ref 80.0–100.0)
Platelets: 135 10*3/uL — ABNORMAL LOW (ref 150–400)
RBC: 4.68 MIL/uL (ref 4.22–5.81)
RDW: 12.3 % (ref 11.5–15.5)
WBC: 1.2 10*3/uL — CL (ref 4.0–10.5)
nRBC: 0 % (ref 0.0–0.2)

## 2018-06-23 LAB — COMPREHENSIVE METABOLIC PANEL
ALT: 15 U/L (ref 0–44)
AST: 36 U/L (ref 15–41)
Albumin: 2.9 g/dL — ABNORMAL LOW (ref 3.5–5.0)
Alkaline Phosphatase: 43 U/L (ref 38–126)
Anion gap: 21 — ABNORMAL HIGH (ref 5–15)
BUN: 23 mg/dL — ABNORMAL HIGH (ref 6–20)
CHLORIDE: 104 mmol/L (ref 98–111)
CO2: 16 mmol/L — ABNORMAL LOW (ref 22–32)
Calcium: 7.4 mg/dL — ABNORMAL LOW (ref 8.9–10.3)
Creatinine, Ser: 1.68 mg/dL — ABNORMAL HIGH (ref 0.61–1.24)
GFR calc Af Amer: 53 mL/min — ABNORMAL LOW (ref >60)
GFR calc non Af Amer: 46 mL/min — ABNORMAL LOW (ref >60)
Glucose, Bld: 157 mg/dL — ABNORMAL HIGH (ref 70–99)
Potassium: 3 mmol/L — ABNORMAL LOW (ref 3.5–5.1)
Sodium: 141 mmol/L (ref 135–145)
Total Bilirubin: 0.7 mg/dL (ref 0.3–1.2)
Total Protein: 5.5 g/dL — ABNORMAL LOW (ref 6.5–8.1)

## 2018-06-23 LAB — LACTIC ACID, PLASMA
Lactic Acid, Venous: 10.5 mmol/L (ref 0.5–1.9)
Lactic Acid, Venous: 9.7 mmol/L (ref 0.5–1.9)

## 2018-06-23 LAB — POCT I-STAT 3, ART BLOOD GAS (G3+)
ACID-BASE DEFICIT: 6 mmol/L — AB (ref 0.0–2.0)
Bicarbonate: 22.7 mmol/L (ref 20.0–28.0)
O2 Saturation: 98 %
Patient temperature: 97.8
TCO2: 24 mmol/L (ref 22–32)
pCO2 arterial: 55.5 mmHg — ABNORMAL HIGH (ref 32.0–48.0)
pH, Arterial: 7.216 — ABNORMAL LOW (ref 7.350–7.450)
pO2, Arterial: 118 mmHg — ABNORMAL HIGH (ref 83.0–108.0)

## 2018-06-23 LAB — CBC WITH DIFFERENTIAL/PLATELET
Abs Immature Granulocytes: 0.01 10*3/uL (ref 0.00–0.07)
Basophils Absolute: 0 10*3/uL (ref 0.0–0.1)
Basophils Relative: 1 %
Eosinophils Absolute: 0 10*3/uL (ref 0.0–0.5)
Eosinophils Relative: 1 %
HCT: 42.8 % (ref 39.0–52.0)
Hemoglobin: 13.3 g/dL (ref 13.0–17.0)
Immature Granulocytes: 1 %
Lymphocytes Relative: 25 %
Lymphs Abs: 0.5 10*3/uL — ABNORMAL LOW (ref 0.7–4.0)
MCH: 31 pg (ref 26.0–34.0)
MCHC: 31.1 g/dL (ref 30.0–36.0)
MCV: 99.8 fL (ref 80.0–100.0)
MONO ABS: 0.2 10*3/uL (ref 0.1–1.0)
Monocytes Relative: 9 %
Neutro Abs: 1.2 10*3/uL — ABNORMAL LOW (ref 1.7–7.7)
Neutrophils Relative %: 63 %
Platelets: 120 10*3/uL — ABNORMAL LOW (ref 150–400)
RBC: 4.29 MIL/uL (ref 4.22–5.81)
RDW: 12.3 % (ref 11.5–15.5)
WBC: 1.8 10*3/uL — ABNORMAL LOW (ref 4.0–10.5)
nRBC: 0 % (ref 0.0–0.2)

## 2018-06-23 LAB — PHOSPHORUS: Phosphorus: 2.4 mg/dL — ABNORMAL LOW (ref 2.5–4.6)

## 2018-06-23 LAB — BASIC METABOLIC PANEL
Anion gap: 15 (ref 5–15)
BUN: 19 mg/dL (ref 6–20)
CHLORIDE: 100 mmol/L (ref 98–111)
CO2: 21 mmol/L — AB (ref 22–32)
Calcium: 8.5 mg/dL — ABNORMAL LOW (ref 8.9–10.3)
Creatinine, Ser: 1.08 mg/dL (ref 0.61–1.24)
GFR calc Af Amer: >60 mL/min (ref >60)
GFR calc non Af Amer: >60 mL/min (ref >60)
Glucose, Bld: 140 mg/dL — ABNORMAL HIGH (ref 70–99)
Potassium: 3.8 mmol/L (ref 3.5–5.1)
Sodium: 136 mmol/L (ref 135–145)

## 2018-06-23 LAB — AMMONIA: Ammonia: 54 umol/L — ABNORMAL HIGH (ref 9–35)

## 2018-06-23 LAB — GLUCOSE, CAPILLARY
Glucose-Capillary: 147 mg/dL — ABNORMAL HIGH (ref 70–99)
Glucose-Capillary: 111 mg/dL — ABNORMAL HIGH (ref 70–99)
Glucose-Capillary: 101 mg/dL — ABNORMAL HIGH (ref 70–99)
Glucose-Capillary: 79 mg/dL (ref 70–99)
Glucose-Capillary: 51 mg/dL — ABNORMAL LOW (ref 70–99)

## 2018-06-23 LAB — CEA: CEA: 9.4 ng/mL — ABNORMAL HIGH (ref 0.0–4.7)

## 2018-06-23 LAB — TROPONIN I
Troponin I: <0.03 ng/mL (ref <0.03)
Troponin I: <0.03 ng/mL (ref <0.03)
Troponin I: 0.09 ng/mL (ref <0.03)
Troponin I: 0.18 ng/mL (ref <0.03)

## 2018-06-23 LAB — MAGNESIUM
Magnesium: 3 mg/dL — ABNORMAL HIGH (ref 1.7–2.4)
Magnesium: 2.9 mg/dL — ABNORMAL HIGH (ref 1.7–2.4)

## 2018-06-23 MED ORDER — NOREPINEPHRINE 16 MG/250ML-% IV SOLN
0.0000 ug/min | INTRAVENOUS | Status: DC
  Administered 2018-06-23 – 2018-06-24 (×4): 40 ug/min via INTRAVENOUS
  Filled 2018-06-23: qty 250
  Filled 2018-06-23: qty 16
  Filled 2018-06-23 (×4): qty 250

## 2018-06-23 MED ORDER — MIDAZOLAM HCL 2 MG/2ML IJ SOLN
2.0000 mg | INTRAMUSCULAR | Status: AC | PRN
  Administered 2018-06-23 – 2018-06-24 (×3): 2 mg via INTRAVENOUS
  Filled 2018-06-23 (×2): qty 2

## 2018-06-23 MED ORDER — CHLORHEXIDINE GLUCONATE 0.12% ORAL RINSE (MEDLINE KIT)
15.0000 mL | Freq: Two times a day (BID) | OROMUCOSAL | Status: DC
  Administered 2018-06-23 – 2018-06-24 (×2): 15 mL via OROMUCOSAL

## 2018-06-23 MED ORDER — SODIUM BICARBONATE 8.4 % IV SOLN
100.0000 meq | Freq: Once | INTRAVENOUS | Status: AC
  Administered 2018-06-23: 100 meq via INTRAVENOUS

## 2018-06-23 MED ORDER — PANTOPRAZOLE SODIUM 40 MG IV SOLR
40.0000 mg | Freq: Every day | INTRAVENOUS | Status: DC
  Administered 2018-06-23 – 2018-06-24 (×2): 40 mg via INTRAVENOUS
  Filled 2018-06-23 (×2): qty 40

## 2018-06-23 MED ORDER — IPRATROPIUM-ALBUTEROL 0.5-2.5 (3) MG/3ML IN SOLN: 3.0000 mL | Freq: Four times a day (QID) | RESPIRATORY_TRACT | Status: DC | PRN

## 2018-06-23 MED ORDER — MAGNESIUM SULFATE 2 GM/50ML IV SOLN
2.0000 g | Freq: Once | INTRAVENOUS | Status: AC
  Administered 2018-06-23: 2 g via INTRAVENOUS
  Filled 2018-06-23: qty 50

## 2018-06-23 MED ORDER — MIDAZOLAM HCL 2 MG/2ML IJ SOLN
2.0000 mg | INTRAMUSCULAR | Status: DC | PRN
  Filled 2018-06-23 (×2): qty 2

## 2018-06-23 MED ORDER — NOREPINEPHRINE 4 MG/250ML-% IV SOLN
0.0000 ug/min | INTRAVENOUS | Status: DC
  Administered 2018-06-23: 40 ug/min via INTRAVENOUS
  Administered 2018-06-23: 5 ug/min via INTRAVENOUS
  Filled 2018-06-23 (×3): qty 250

## 2018-06-23 MED ORDER — LORAZEPAM 2 MG/ML IJ SOLN
INTRAMUSCULAR | Status: AC
  Administered 2018-06-23: 2 mg
  Filled 2018-06-23: qty 2

## 2018-06-23 MED ORDER — POTASSIUM CHLORIDE 10 MEQ/50ML IV SOLN: 10.0000 meq | INTRAVENOUS | Status: DC

## 2018-06-23 MED ORDER — LORAZEPAM 2 MG/ML IJ SOLN
2.0000 mg | INTRAMUSCULAR | Status: AC
  Administered 2018-06-23: 2 mg via INTRAVENOUS

## 2018-06-23 MED ORDER — PIPERACILLIN-TAZOBACTAM 3.375 G IVPB
3.3750 g | Freq: Three times a day (TID) | INTRAVENOUS | Status: DC
  Administered 2018-06-24 (×2): 3.375 g via INTRAVENOUS
  Filled 2018-06-23 (×3): qty 50

## 2018-06-23 MED ORDER — POTASSIUM CHLORIDE 10 MEQ/50ML IV SOLN
10.0000 meq | INTRAVENOUS | Status: AC
  Administered 2018-06-23 (×2): 10 meq via INTRAVENOUS
  Filled 2018-06-23 (×2): qty 50

## 2018-06-23 MED ORDER — FENTANYL CITRATE (PF) 100 MCG/2ML IJ SOLN
50.0000 ug | Freq: Once | INTRAMUSCULAR | Status: AC
  Administered 2018-06-23: 50 ug via INTRAVENOUS

## 2018-06-23 MED ORDER — SODIUM CHLORIDE 0.9 % IV BOLUS: 1000.0000 mL | Freq: Once | INTRAVENOUS | Status: DC

## 2018-06-23 MED ORDER — FENTANYL CITRATE (PF) 100 MCG/2ML IJ SOLN
100.0000 ug | INTRAMUSCULAR | Status: AC | PRN
  Administered 2018-06-23 – 2018-06-24 (×3): 100 ug via INTRAVENOUS
  Filled 2018-06-23: qty 2

## 2018-06-23 MED ORDER — HYDROCORTISONE NA SUCCINATE PF 100 MG IJ SOLR
50.0000 mg | Freq: Once | INTRAMUSCULAR | Status: AC
  Administered 2018-06-23: 50 mg via INTRAVENOUS

## 2018-06-23 MED ORDER — ETOMIDATE 2 MG/ML IV SOLN: 0.3000 mg/kg | Freq: Once | INTRAVENOUS | Status: DC

## 2018-06-23 MED ORDER — LORAZEPAM 2 MG/ML IJ SOLN
1.0000 mg | Freq: Once | INTRAMUSCULAR | Status: AC
  Administered 2018-06-23: 1 mg via INTRAVENOUS

## 2018-06-23 MED ORDER — POTASSIUM PHOSPHATES 15 MMOLE/5ML IV SOLN
20.0000 mmol | Freq: Once | INTRAVENOUS | Status: AC
  Administered 2018-06-23: 20 mmol via INTRAVENOUS
  Filled 2018-06-23: qty 6.67

## 2018-06-23 MED ORDER — ROCURONIUM BROMIDE 50 MG/5ML IV SOLN
1.0000 mg/kg | Freq: Once | INTRAVENOUS | Status: AC
  Administered 2018-06-23: 69 mg via INTRAVENOUS
  Filled 2018-06-23: qty 6.9

## 2018-06-23 MED ORDER — FENTANYL 2500MCG IN NS 250ML (10MCG/ML) PREMIX INFUSION
0.0000 ug/h | INTRAVENOUS | Status: DC
  Administered 2018-06-23: 50 ug/h via INTRAVENOUS
  Administered 2018-06-24 (×2): 300 ug/h via INTRAVENOUS
  Administered 2018-06-24: 400 ug/h via INTRAVENOUS
  Filled 2018-06-23 (×4): qty 250

## 2018-06-23 MED ORDER — NOREPINEPHRINE 4 MG/250ML-% IV SOLN
INTRAVENOUS | Status: AC
  Administered 2018-06-23: 4 mg
  Filled 2018-06-23: qty 250

## 2018-06-23 MED ORDER — VASOPRESSIN 20 UNIT/ML IV SOLN
0.0300 [IU]/min | INTRAVENOUS | Status: DC
  Administered 2018-06-23 (×2): 0.03 [IU]/min via INTRAVENOUS
  Filled 2018-06-23 (×3): qty 2

## 2018-06-23 MED ORDER — IOPAMIDOL (ISOVUE-370) INJECTION 76%
100.0000 mL | Freq: Once | INTRAVENOUS | Status: AC | PRN
  Administered 2018-06-23: 100 mL via INTRAVENOUS

## 2018-06-23 MED ORDER — FENTANYL CITRATE (PF) 100 MCG/2ML IJ SOLN
100.0000 ug | INTRAMUSCULAR | Status: DC | PRN
  Filled 2018-06-23 (×2): qty 2

## 2018-06-23 MED ORDER — SODIUM CHLORIDE 0.9 % IV BOLUS: 1000.0000 mL | Freq: Once | INTRAVENOUS | Status: AC

## 2018-06-23 MED ORDER — ORAL CARE MOUTH RINSE
15.0000 mL | OROMUCOSAL | Status: DC
  Administered 2018-06-23 – 2018-06-24 (×7): 15 mL via OROMUCOSAL

## 2018-06-23 MED ORDER — NOREPINEPHRINE 16 MG/250ML-% IV SOLN
0.0000 ug/min | INTRAVENOUS | Status: DC
  Filled 2018-06-23: qty 250

## 2018-06-23 MED ORDER — PIPERACILLIN-TAZOBACTAM 3.375 G IVPB
3.3750 g | Freq: Three times a day (TID) | INTRAVENOUS | Status: DC
  Administered 2018-06-23 (×2): 3.375 g via INTRAVENOUS
  Filled 2018-06-23 (×3): qty 50

## 2018-06-23 MED ORDER — DEXMEDETOMIDINE HCL IN NACL 400 MCG/100ML IV SOLN
0.0000 ug/kg/h | INTRAVENOUS | Status: DC
  Administered 2018-06-23: 0.4 ug/kg/h via INTRAVENOUS
  Filled 2018-06-23: qty 100

## 2018-06-23 NOTE — Progress Notes (Signed)
Received pt from 6N as Rapid Response.  Pt an emergent RSI at 0650, experienced cardiac arrest at [phone] with ACLS provided, ROSC at [phone].  Bedside handoff communication to Tiney Rouge, RN.

## 2018-06-23 NOTE — Progress Notes (Signed)
[facility]/Trauma Progress Note      Assessment/Plan Tobacco abuse Alcohol abuse  Obstructing descending colon mass - GI recommends surgery and not scope, they have signed off - CEA 9.4 - pt will need a partial colectomy this admission with likely colostomy timing dependant on pt's stability - continue OG to LIWS   Acute respiratory failure and cardiac arrest - required intubation, ICU  FEN: OGT, NPO, IVF VTE: SCD's, heparin ID: Zosyn 12/04>> Foley: none Follow up: TBD   LOS: 2 days    Subjective: CC: colon mass  Pt had respiratory distress overnight and cardiac arrest. He is now intubated and currently sedated. No family at bedside.   Objective: Vital signs in last 24 hours: Temp:  [98.5 F (36.9 C)-99.5 F (37.5 C)] 98.5 F (36.9 C) (12/04 0250) Pulse Rate:  [77-192] 137 (12/04 0705) Resp:  [15-20] 15 (12/04 0705) BP: (85-152)/(67-104) 101/72 (12/04 0705) SpO2:  [94 %-99 %] 99 % (12/04 0705) FiO2 (%):  [100 %] 100 % (12/04 0707) Last BM Date: 06/17/18  Intake/Output from previous day: 12/03 0701 - 12/04 0700 In: 1705.6 [I.V.:1705.6] Out: 1415 [Urine:425; Emesis/NG output:990] Intake/Output this shift: No intake/output data recorded.  PE: Gen:  sedated Card:  tachy Pulm:  CTA, no W/R/R, on vent Abd: Soft, mild distention, hypoactive BS Skin: no rashes noted, warm and dry   Anti-infectives: Anti-infectives (From admission, onward)   Start     Dose/Rate Route Frequency Ordered Stop   06/23/18 0730  piperacillin-tazobactam (ZOSYN) IVPB 3.375 g     3.375 g 12.5 mL/hr over 240 Minutes Intravenous Every 8 hours 06/23/18 0717        Lab Results:  Recent Labs    06/23/2018 1511  WBC 7.0  HGB 16.9  HCT 50.2  PLT 138*   BMET Recent Labs    06/22/18 0212 06/23/18 0252  NA 135 136  K 4.7 3.8  CL 101 100  CO2 21* 21*  GLUCOSE 124* 140*  BUN 15 19  CREATININE 1.06 1.08  CALCIUM 9.0 8.5*   PT/INR No results for input(s):  LABPROT, INR in the last 72 hours. CMP     Component Value Date/Time   NA 136 06/23/2018 0252   K 3.8 06/23/2018 0252   CL 100 06/23/2018 0252   CO2 21 (L) 06/23/2018 0252   GLUCOSE 140 (H) 06/23/2018 0252   BUN 19 06/23/2018 0252   CREATININE 1.08 06/23/2018 0252   CALCIUM 8.5 (L) 06/23/2018 0252   PROT 8.6 (H) 07/04/2018 1511   ALBUMIN 4.7 07/17/2018 1511   AST 25 07/19/2018 1511   ALT 19 06/25/2018 1511   ALKPHOS 70 07/04/2018 1511   BILITOT 0.9 07/06/2018 1511   GFRNONAA >60 06/23/2018 0252   GFRAA >60 06/23/2018 0252   Lipase     Component Value Date/Time   LIPASE 20 07/05/2018 1511    Studies/Results: Dg Chest 2 View  Result Date: 07/12/2018 CLINICAL DATA:  Colon cancer. EXAM: CHEST - 2 VIEW COMPARISON:  July 13, 2011 FINDINGS: The heart size and mediastinal contours are within normal limits. Both lungs are clear. The visualized skeletal structures are unremarkable. IMPRESSION: No active cardiopulmonary disease. Electronically Signed   By: Dorise Bullion III M.D   On: 07/19/2018 20:05   Dg Abd 1 View  Result Date: 06/22/2018 CLINICAL DATA:  Evaluate NG tube placement EXAM: ABDOMEN - 1 VIEW COMPARISON:  None. FINDINGS: The NG tube coils in the lower chest with the distal tip in the region  of the distal esophagus. The tube is been pulled back in the interval. IMPRESSION: The NG tube appears to coil in the distal esophagus with the distal tip located above the stomach. Recommend repositioning. Electronically Signed   By: Dorise Bullion III M.D   On: 06/22/2018 19:05   Ct Abdomen Pelvis W Contrast  Result Date: 07/08/2018 CLINICAL DATA:  Abdominal pain and distention with nausea and vomiting as well as constipation 4 days. EXAM: CT ABDOMEN AND PELVIS WITH CONTRAST TECHNIQUE: Multidetector CT imaging of the abdomen and pelvis was performed using the standard protocol following bolus administration of intravenous contrast. CONTRAST:  176mL OMNIPAQUE IOHEXOL 300 MG/ML  SOLN  COMPARISON:  07/10/2011 FINDINGS: Lower chest: Unremarkable. Hepatobiliary: Liver, gallbladder and biliary tree are normal. Pancreas: Normal. Spleen: Normal Adrenals/Urinary Tract: Adrenal glands are normal. Kidneys are normal in size without hydronephrosis or nephrolithiasis. Ureters and bladder are normal. Stomach/Bowel: Stomach is normal. Proximal small bowel demonstrates evidence of mild wall thickening with multiple air and fluid-filled dilated mid to distal small bowel loops measuring up to 3.5 cm in diameter. This dilated small bowel extends to the ileocecal valve. There is air and fluid-filled mildly prominent colon to the level of the mid descending colon where there is focal narrowing and abnormal soft tissue density. This may represent a constricting neoplastic process. There is air and stool within the colon distal to this focal stricture. Vascular/Lymphatic: Vascular structures are unremarkable. No adenopathy. Reproductive: Normal. Other: No significant free fluid or focal inflammatory change. Musculoskeletal: Normal. 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 There are multiple air and fluid-filled dilated small bowel loops secondary to this obstructive process. Recommended GI consultation for endoscopic correlation and biopsy. Mild wall thickening of a jejunal loops which is nonspecific. Electronically Signed   By: Marin Olp M.D.   On: 07/02/2018 17:32   Dg Abd Portable 1 View  Result Date: 06/20/2018 CLINICAL DATA:  Post NG tube placement. EXAM: PORTABLE ABDOMEN - 1 VIEW COMPARISON:  Same day CT abdomen and pelvis FINDINGS: The side port of a gastric tube is seen at the GE junction and further advancement by at least 3 cm is recommended. Dilated small and large bowel loops are identified to the mid descending colon similar in appearance to the CT  findings. The diffusely dilated small bowel loops measure up to 3.9 cm in caliber slightly prominent than on CT. Given dilatation large bowel to the level of the cecum, findings are likely related to incompetence of the ileocecal valve. No free air is identified. IMPRESSION: 1. New gastric tube in place with side port at the GE junction. Further advancement of the tube is suggested by at least 3 cm. 2. Gaseous distention of small and large bowel loops as above. Electronically Signed   By: Ashley Royalty M.D.   On: 06/25/2018 23:18      Kalman Drape , [facility] 06/23/2018, 7:42 AM  Pager: [phone] Mon-Wed, Friday 7:00am-4:30pm Thurs 7am-11:30am  Consults: [phone]

## 2018-06-23 NOTE — Progress Notes (Signed)
   06/23/18 0758  Clinical Encounter Type  Visited With Family;Health care provider  Visit Type Follow-up;Spiritual support;Psychological support;Code  Referral From Other (Comment) (code blue)  Spiritual Encounters  Spiritual Needs Emotional;Grief support  Stress Factors  Family Stress Factors Loss of control;Health changes   Family came back to conference rm.  Met w/ them for about 72m  Compassionate presence.  Pt's SO has other health problems herself.  Family has experienced multiple other health crises and one death this year, so feeling compounded grief and stress.  Very appreciative of support.  AMyra Gianottiresident, [phone]

## 2018-06-23 NOTE — Progress Notes (Signed)
RT received pt on ICU for intubation related to metabolic acidosis and AMS. RT assisted NP Hoffman with intubation of pt with a 8 ETT, using a mac 4 glyde blade, 26 at the lip and tube holder in place. Pt arrested and bagged until stable enough to be placed on vent. Pt vent settings per CCM pt 8cc, rate 26, PEEP of 8, FIO2 of 100. Pt stable at this point respiratory wise. RT will continue to monitor.

## 2018-06-23 NOTE — Progress Notes (Signed)
   06/23/18 0700  Clinical Encounter Type  Visited With Health care provider;Patient not available  Visit Type Initial;Code  Referral From Other (Comment) (code blue)  Spiritual Encounters  Spiritual Needs Emotional   Responded to code blue pg, family reported to be in waiting rm, but could not locate in waiting rm, conference rm, or elsewhere in proximity to unit.  Another employee reported that family member walked off after hearing news.  Chaplain remains available.  Pls page to return if needed for family or other support.  Myra Gianotti resident, [phone]

## 2018-06-23 NOTE — Progress Notes (Signed)
CRITICAL VALUE ALERT  Critical Value:  Troponin .09  Date & Time Notied:  06/23/18 1500  Provider Notified: Dr. Nelda Marseille CCM  Orders Received/Actions taken: none at this time

## 2018-06-23 NOTE — Progress Notes (Addendum)
Text paged on-call [facility] d/t patient's c/o chest pain and difficulty breathing.  Administered Ativan 1 mg Inj, did 12-leads EKG, applied O2 at 2L/min.  EKG showed sustained SVT and patient become confused. Rapid Response then called for assistance. Wasted 1 mg Ativan with Isidoro Donning, RN.

## 2018-06-23 NOTE — Progress Notes (Addendum)
Patient's BP is highly fluctuant.  I had conversation with the family earlier and we have decided on no CPR/cardioversion.  They are aware that we are at max support at this point and there is nothing further to do unless he stabilizes enough for surgery to intervene.  I reviewed the CT myself, and compared to previous CT, there is a development of enteritis and concern for severe infection.  No evidence of perf.  At this point, we will not escalate care further but family is not quite ready for withdrawal.  The patient is critically ill with multiple organ systems failure and requires high complexity decision making for assessment and support, frequent evaluation and titration of therapies, application of advanced monitoring technologies and extensive interpretation of multiple databases.   Critical Care Time devoted to patient care services described in this note is  30  Minutes. This time reflects time of care of this signee Dr Jennet Maduro. This critical care time does not reflect procedure time, or teaching time or supervisory time of PA/NP/Med student/Med Resident etc but could involve care discussion time.  Rush Farmer, M.D. [facility] Pulmonary/Critical Care Medicine. Pager: [phone]. After hours pager: [phone].

## 2018-06-23 NOTE — Significant Event (Addendum)
Rapid Response Event Note  Overview: Tachycardia and Respiratory Distress   Initial Focused Assessment: Called by RNs about patient's HR bring in the 190s, patient was endorsing chest discomfort per RN and was in respiratory distress. Upon arrival, patient was laying in bed, was altered, denied pain, patient was very confused, disoriented x 3. EKG - SVT in the 190s, profusely diaphoretic, SBP in the 100s, MAPs in the 80s. RR > 45, extensive use of abdominal muscles, + WOB and + SOB, minimal air movement, abdomen very distended and tight (very hypoactive bowel sounds). Patient became acutely agitated and hypoxic, it took several staff to keep patient safe in bed. Saturations were in the low 50s, placed on 100% NRB 15L and saturations improved.TRH NP was paged and paged returned at [place]. Prior to my arrival, patient was given Ativan 1 mg IV. MD came to bedside to assess patient. RT was also called to bedside.  Interventions: - Patient was given Ativan 2 mg IM given at 0547 - loss of IV access - IV access established - Lopressor 5 mg IV x 1 at 0600 - Ativan 2 mg IV x 1 at 0603 - NS bolus started for mild hypotension SBP in the 70s, bolus started, improved into the 90s.  - ABG - 7.28/28.5/136/13.0 - STAT LABS were ordered - PCCM was consulted and PCCM NP came to bedside - Decision made to move patient to ICU - 2nd PIV established  Plan of Care: - Patient transferred to 2M12, fluid bolus restarted, Levo started at 5 mcg/kg/min. - Intubated in MICU, given 20mg  Etomidate, 50 mcg Fentanyl, and 50 mg Rocuronium.  - Hypotensive after intubation, became bradycardiac - PEA, CODE BLUE called. See Code Sheet. ROSC achieved.  Event Summary:    at    Call Time 0513 Arrival Time 0515 Arrival Time in MICU 0616 Code [place]  End Time [place], [place]

## 2018-06-23 NOTE — Consult Note (Signed)
NAME:  Christopher English, MRN:  [id number], DOB:  [birth year], LOS: 2 ADMISSION DATE:  07/08/2018, CONSULTATION DATE: 12/4 REFERRING MD:  Dr. Broadus John, CHIEF COMPLAINT:  AMS   Brief History   52 year old male with history of ETOH admitted 12/2 for abdominal pain and distention. Found to have colonic mass which will require surgery. Prior to surgery, became altered and unstable requiring ICU transfer.   History of present illness   52 year old male with PMH as below, which is significant for alcohol abuse, admitted 12/2 for complaints of abdominal pain and distention. He drinks 4, 24-ounce beers per day. Last drink was thanksgiving day before he started to feel sick, 4 days prior to my admission. 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 He was admitted with surgical consultation.  We were consulted due to altered mental status and increased work of breathing. Patient was evaluated on the floor and decided to transfer him to the unit immediately for intubation. Patient obviously not able to give no history.  Patient was intubated immediately for respiratory failure and shortly after intubation he went into bradycardia and then PEA arrest and vfib requiring shock.   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 There are multiple air and fluid-filled dilated small bowel loops secondary to this obstructive process. Recommended GI consultation for endoscopic correlation and biopsy.  Mild wall  thickening of a jejunal loops which is nonspecific. Micro Data:  Blood 12/4>>> Urine 12/4>>> Sputum 12/4>>>  Antimicrobials:  Zosyn 12/4 >>  Interim history/subjective:  Cardiac arrest overnight, intubated, refractory septic shock  Objective   Blood pressure (!) 82/69, pulse (!) 136, temperature 98.7 F (37.1 C), temperature source Oral, resp. rate (!) 26, height 5' 7.5" (1.715 m), weight 69 kg, SpO2 98 %.    Vent Mode: PRVC FiO2 (%):  [100 %] 100 % Set Rate:  [26 bmp] 26 bmp Vt Set:  [520 mL] 520 mL PEEP:  [8 cmH20] 8 cmH20 Plateau Pressure:  [21 cmH20] 21 cmH20   Intake/Output Summary (Last 24 hours) at 06/23/2018 0843 Last data filed at 06/23/2018 0220 Gross per 24 hour  Intake 1705.59 ml  Output 1415 ml  Net 290.59 ml   Filed Weights   07/14/2018 2237  Weight: 69 kg    Examination: General: Acutely ill appearing male, unresponsive HENT: [place]/AT, PERRL, EOM-I and MMM Lungs: Coarse BS diffusely Cardiovascular: Sinus tach, Nl S1/S2 and -M/R/G Abdomen: Extremely distended, hard, NT and -BS Extremities: -edema and -tenderness Neuro: Unresponsive post arrest  Resolved Hospital Problem list     Assessment & Plan:  52 year old male with history of etoh abuse presenting with a colonic mass, elevated CEA and bowel obstruction who vomited, aspirated and had a cardiac arrest.  Discussed with PCCM-NP.  Refractory septic shock:  - Levophed for BP support  - Add vasopressin  - Add stress dose steroids   - Further addition of pressors at this time  - CVP  - IVF resuscitation  - Place TLC and a-line  Cardiac arrest:  - LCB with no CPR/cardioversion  - Tele monitoring  Acute respiratory failure:  - Increase RR to 32 for mixed acidosis  - Titrate O2 for sat of 88-92%  - Adjust vent for ABG  - CXR and ABG in AM if he survives til then  - Hypokalemia   - Replace IV  - BMET in AM  - IVF resuscitation  - Aspiration pneumonia   - Zosyn  - Alcohol abuse   - Precedex  if begins to show signs of withdrawal  - Thiamine and folate and MVI  - Bowel obstruction with colon mass   - Surgery following, not a surgical candidate at this point  - Will need a CT of the abdomen when more stable  - Zosyn  - May consider anti-fungals if a concern, not at this time, no perf  GOC:  - Spoke with parents, patient is not doing well, refractory shock, after discussion, decided to make LCB with no CPR/cardioversion but continue current care.  Labs   CBC: Recent Labs  Lab 07/01/2018 1511 06/23/18 0610  WBC 7.0 1.2*  NEUTROABS 5.5  --   HGB 16.9 14.9  HCT 50.2 46.8  MCV 93.5 100.0  PLT 138* 135*    Basic Metabolic Panel: Recent Labs  Lab 06/22/2018 1511 06/22/18 0212 06/23/18 0252 06/23/18 0610  NA 134* 135 136  --   K 3.2* 4.7 3.8  --   CL 96* 101 100  --   CO2 23 21* 21*  --   GLUCOSE 137* 124* 140*  --   BUN 19 15 19   --   CREATININE 1.54* 1.06 1.08  --   CALCIUM 9.8 9.0 8.5*  --   MG  --  2.5*  --  3.0*  PHOS  --  3.5  --   --    GFR: Estimated Creatinine Clearance: 76.2 mL/min (by C-G formula based on SCr of 1.08 mg/dL). Recent Labs  Lab 07/17/2018 1511 06/23/18 0610 06/23/18 0611  WBC 7.0 1.2*  --   LATICACIDVEN  --   --  10.5*    Liver Function Tests: Recent Labs  Lab 07/20/2018 1511  AST 25  ALT 19  ALKPHOS 70  BILITOT 0.9  PROT 8.6*  ALBUMIN 4.7   Recent Labs  Lab 07/13/2018 1511  LIPASE 20   Recent Labs  Lab 06/23/18 0610  AMMONIA 54*    ABG    Component Value Date/Time   PHART 7.216 (L) 06/23/2018 0837   PCO2ART 55.5 (H) 06/23/2018 0837   PO2ART 118.0 (H) 06/23/2018 0837   HCO3 22.7 06/23/2018 0837   TCO2 24 06/23/2018 0837   ACIDBASEDEF 6.0 (H) 06/23/2018 0837   O2SAT 98.0 06/23/2018 0837     Coagulation Profile: No results for input(s): INR, PROTIME in the last 168 hours.  Cardiac Enzymes: Recent Labs  Lab 06/23/18 0610  TROPONINI <0.03    HbA1C: No results found for: HGBA1C  CBG: Recent Labs  Lab  06/23/18 0804  GLUCAP 147*    Review of Systems:   Could not be obtained due to altered mental status   Past Medical History  He,  has a past medical history of Alcohol abuse, Bowel obstruction ([place]) (06/2018), and No pertinent past medical history.   Surgical History    Past Surgical History:  Procedure Laterality Date  . NO PAST SURGERIES       Social History   reports that he has been smoking cigarettes. He has a  35.00 pack-year smoking history. He has never used smokeless tobacco. He reports that he drinks about 2.0 standard drinks of alcohol per week. He reports that he does not use drugs.   Family History   His family history is not on file.   Allergies No Known Allergies   Home Medications  Prior to Admission medications   Not on File   The patient is critically ill with multiple organ systems failure and requires high complexity decision making for assessment and support, frequent evaluation and titration of therapies, application of advanced monitoring technologies and extensive interpretation of multiple databases.   Critical Care Time devoted to patient care services described in this note is  60  Minutes. This time reflects time of care of this signee Dr Jennet Maduro. This critical care time does not reflect procedure time, or teaching time or supervisory time of PA/NP/Med student/Med Resident etc but could involve care discussion time.  Rush Farmer, M.D. [facility] Pulmonary/Critical Care Medicine. Pager: [phone]. After hours pager: [phone].

## 2018-06-23 NOTE — Progress Notes (Signed)
Pharmacy Antibiotic Note  Christopher English is a 52 y.o. male admitted on 07/20/2018 with abdominal pain/distention .  Pharmacy has been consulted for Zosyn dosing for r/o sepsis, possible aspiration PNA. WBC WNL. Renal function OK. Pt is s/p cardiac arrest 12/4 AM.   Plan: Zosyn 3.375G IV q8h to be infused over 4 hours Trend WBC, temp, renal function  F/U infectious work-up   Height: 5' 7.5" (171.5 cm) Weight: 152 lb 1.9 oz (69 kg)(from old records, must be updated) IBW/kg (Calculated) : 67.25  Temp (24hrs), Avg:98.9 F (37.2 C), Min:98.5 F (36.9 C), Max:99.5 F (37.5 C)  Recent Labs  Lab 07/20/2018 1511 06/22/18 0212 06/23/18 0252 06/23/18 0611  WBC 7.0  --   --   --   CREATININE 1.54* 1.06 1.08  --   LATICACIDVEN  --   --   --  10.5*    Estimated Creatinine Clearance: 76.2 mL/min (by C-G formula based on SCr of 1.08 mg/dL).    No Known Allergies   Christopher English 06/23/2018 7:18 AM

## 2018-06-23 NOTE — Consult Note (Signed)
NAME:  Christopher English, MRN:  [id number], DOB:  07-20-1966, LOS: 2 ADMISSION DATE:  06/29/2018, CONSULTATION DATE: 12/4 REFERRING MD:  Dr. Broadus John, CHIEF COMPLAINT:  AMS   Brief History   52 year old male with history of ETOH admitted 12/2 for abdominal pain and distention. Found to have colonic mass which will require surgery. Prior to surgery, became altered and unstable requiring ICU transfer.   History of present illness   52 year old male with PMH as below, which is significant for alcohol abuse, admitted 12/2 for complaints of abdominal pain and distention. He drinks 4, 24-ounce beers per day. Last drink was thanksgiving day before he started to feel sick, 4 days prior to my admission. 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 He was admitted with surgical consultation.  We were consulted due to altered mental status and increased work of breathing. Patient was evaluated on the floor and decided to transfer him to the unit immediately for intubation. Patient obviously not able to give no history.  Patient was intubated immediately for respiratory failure and shortly after intubation he went into bradycardia and then PEA arrest and vfib requiring shock.     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 There are multiple air and fluid-filled dilated small bowel loops secondary to this obstructive process. Recommended GI consultation for endoscopic correlation and biopsy.  Mild wall  thickening of a jejunal loops which is nonspecific. Micro Data:    Antimicrobials:  Zosyn >>  Interim history/subjective:    Objective   Blood pressure 99/73, pulse (!) 192, temperature 98.5 F (36.9 C), temperature source Oral, resp. rate 20, height 5\' 8"  (1.727 m), weight 69 kg, SpO2 94 %.        Intake/Output Summary (Last 24 hours) at 06/23/2018 5462 Last data filed at 06/23/2018 0220 Gross per 24 hour  Intake 1705.59 ml  Output 1415 ml  Net 290.59 ml   Filed Weights   06/26/2018 2237  Weight: 69 kg    Examination: General: acutely ill toxic and lethargic  HENT: dry  Lungs: clear equal air sounds bilaterally  Cardiovascular: tachycardic no added sounds no murmurs  Abdomen: extremely distended rigid  Extremities: no edema warm  Neuro: lethargic moving all limbs    Resolved Hospital Problem list     Assessment & Plan:  Assessment: - septic shock - cardiac arrest  - hypokalemia  - Acute hypoxemic respiratory failure - aspiration pneumonia  - alcohol abuse  - bowel obstruction with colon mass    Plan: - titrate levophed to keep map >65  - start zosyn  - send sputum culture - CT abd with contrast to rule out ischemic bowel from obstruction  - adjust mechanical ventilation and use lung protective strategy keep pox >92%  - replace potasium  - given 1 dose of amiodarone for vfib - stat CXR  - insert NGT keep to suction  - appreciate GSx input    I have spent 50 mins of CC time bedside or in the  unit   Best practice:  Diet: NPO  Pain/Anxiety/Delirium protocol (if indicated):  VAP protocol (if indicated):  DVT prophylaxis:  GI prophylaxis:  Glucose control:  Mobility:  Code Status:  Family Communication: Disposition:   Labs   CBC: Recent Labs  Lab 07/06/2018 1511  WBC 7.0  NEUTROABS 5.5  HGB 16.9  HCT 50.2  MCV 93.5  PLT 138*    Basic Metabolic Panel: Recent Labs  Lab 06/30/2018 1511 06/22/18 0212 06/23/18 0252  NA 134* 135 136  K  3.2* 4.7 3.8  CL 96* 101 100  CO2 23 21* 21*  GLUCOSE 137* 124* 140*  BUN 19 15 19   CREATININE 1.54* 1.06 1.08  CALCIUM 9.8 9.0 8.5*  MG  --  2.5*  --   PHOS  --  3.5  --    GFR: Estimated Creatinine Clearance: 77.4 mL/min (by C-G formula based on SCr of 1.08 mg/dL). Recent Labs  Lab 07/20/2018 1511  WBC 7.0    Liver Function Tests: Recent Labs  Lab 07/11/2018 1511  AST 25  ALT 19  ALKPHOS 70  BILITOT 0.9  PROT 8.6*  ALBUMIN 4.7   Recent Labs  Lab 07/10/2018 1511  LIPASE 20   No results for input(s): AMMONIA in the last 168 hours.  ABG    Component Value Date/Time   PHART 7.280 (L) 06/23/2018 0605   PCO2ART 28.5 (L) 06/23/2018 0605   PO2ART 136 (H) 06/23/2018 0605   HCO3 13.0 (L) 06/23/2018 0605   TCO2 14 07/10/2011 1404   ACIDBASEDEF 12.5 (H) 06/23/2018 0605   O2SAT 98.2 06/23/2018 0605     Coagulation Profile: No results for input(s): INR, PROTIME in the last 168 hours.  Cardiac Enzymes: No results for input(s): CKTOTAL, CKMB, CKMBINDEX, TROPONINI in the last 168 hours.  HbA1C: No results found for: HGBA1C  CBG: No results for input(s): GLUCAP in the last 168 hours.  Review of Systems:   Could not be obtained due to altered mental status   Past Medical History  He,  has a past medical history of Alcohol abuse, Bowel obstruction ([place]) (06/2018), and No pertinent past medical history.   Surgical History    Past Surgical History:  Procedure Laterality Date  . NO PAST SURGERIES       Social History   reports that he has been smoking cigarettes. He has a 35.00 pack-year smoking history. He has never used smokeless tobacco. He reports that he drinks about 2.0 standard drinks of alcohol per week. He reports that he does not use drugs.   Family History   His family history is not on file.   Allergies No Known Allergies   Home Medications  Prior to Admission medications   Not on File     Critical care time:

## 2018-06-23 NOTE — Progress Notes (Signed)
Patient in ICU under PCCM service. TRH will sign off.   Christopher English. MD [phone]

## 2018-06-23 NOTE — Progress Notes (Signed)
Progressive hypotension despite maxed on levophed and vasopressin. MD notified and 1L bolus ordered.

## 2018-06-23 NOTE — Progress Notes (Signed)
Pt remains hypotensive, despite maxed pressors. No change in BP after fluid bolus. Physician aware at bedside and updated family on patients condition.  No new orders at this time.

## 2018-06-23 NOTE — Procedures (Signed)
Central Venous Catheter Insertion Procedure Note HAYLEN SHELNUTT [id number] 1966-04-19  Procedure: Insertion of Central Venous Catheter Indications: Assessment of intravascular volume, Drug and/or fluid administration and Frequent blood sampling  Procedure Details Consent: Unable to obtain consent because of altered level of consciousness. Time Out: Verified patient identification, verified procedure, site/side was marked, verified correct patient position, special equipment/implants available, medications/allergies/relevent history reviewed, required imaging and test results available.  Performed  Maximum sterile technique was used including antiseptics, cap, gloves, gown, hand hygiene, mask and sheet. Skin prep: Chlorhexidine; local anesthetic administered A antimicrobial bonded/coated triple lumen catheter was placed in the left internal jugular vein using the Seldinger technique.  Evaluation Blood flow good Complications: No apparent complications Patient did tolerate procedure well. Chest X-ray ordered to verify placement.  CXR: pending.  U/S used in placement  Laina Guerrieri 06/23/2018, 9:31 AM

## 2018-06-23 NOTE — Progress Notes (Signed)
Patient transferred to the ICU for further management, and more complex clinical monitoring. Patient is unstable this time. Respiratory compromise, increase WOB. Metabolic Acidosis. Report given to unit RRT.

## 2018-06-23 NOTE — Procedures (Signed)
Arterial Catheter Insertion Procedure Note Christopher English [id number] 03/26/1966  Procedure: Insertion of Arterial Catheter  Indications: Blood pressure monitoring and Frequent blood sampling  Procedure Details Consent: Unable to obtain consent because of emergent medical necessity. Time Out: Verified patient identification, verified procedure, site/side was marked, verified correct patient position, special equipment/implants available, medications/allergies/relevent history reviewed, required imaging and test results available.  Performed  Maximum sterile technique was used including antiseptics, cap, gloves, gown, hand hygiene, mask and sheet. Skin prep: Chlorhexidine; local anesthetic administered 20 gauge catheter was inserted into right radial artery using the Seldinger technique. ULTRASOUND GUIDANCE USED: NO Evaluation Blood flow good; BP tracing good. Complications: No apparent complications.   Christopher English 06/23/2018

## 2018-06-24 ENCOUNTER — Inpatient Hospital Stay (HOSPITAL_COMMUNITY): Payer: Medicaid Other

## 2018-06-24 DIAGNOSIS — F1010 Alcohol abuse, uncomplicated: Secondary | ICD-10-CM

## 2018-06-24 DIAGNOSIS — N179 Acute kidney failure, unspecified: Secondary | ICD-10-CM

## 2018-06-24 DIAGNOSIS — Z7189 Other specified counseling: Secondary | ICD-10-CM

## 2018-06-24 DIAGNOSIS — D696 Thrombocytopenia, unspecified: Secondary | ICD-10-CM

## 2018-06-24 DIAGNOSIS — Z515 Encounter for palliative care: Secondary | ICD-10-CM

## 2018-06-24 DIAGNOSIS — R579 Shock, unspecified: Secondary | ICD-10-CM

## 2018-06-24 LAB — POCT I-STAT 3, ART BLOOD GAS (G3+)
Acid-base deficit: 7 mmol/L — ABNORMAL HIGH (ref 0.0–2.0)
Bicarbonate: 19.3 mmol/L — ABNORMAL LOW (ref 20.0–28.0)
O2 Saturation: 94 %
Patient temperature: 98
TCO2: 21 mmol/L — AB (ref 22–32)
pCO2 arterial: 39.9 mmHg (ref 32.0–48.0)
pH, Arterial: 7.291 — ABNORMAL LOW (ref 7.350–7.450)
pO2, Arterial: 76 mmHg — ABNORMAL LOW (ref 83.0–108.0)

## 2018-06-24 LAB — BASIC METABOLIC PANEL
ANION GAP: 9 (ref 5–15)
BUN: 37 mg/dL — ABNORMAL HIGH (ref 6–20)
CO2: 18 mmol/L — ABNORMAL LOW (ref 22–32)
Calcium: 5.6 mg/dL — CL (ref 8.9–10.3)
Chloride: 107 mmol/L (ref 98–111)
Creatinine, Ser: 2.07 mg/dL — ABNORMAL HIGH (ref 0.61–1.24)
GFR calc Af Amer: 41 mL/min — ABNORMAL LOW (ref >60)
GFR calc non Af Amer: 36 mL/min — ABNORMAL LOW (ref >60)
Glucose, Bld: 135 mg/dL — ABNORMAL HIGH (ref 70–99)
Potassium: 4.5 mmol/L (ref 3.5–5.1)
Sodium: 134 mmol/L — ABNORMAL LOW (ref 135–145)

## 2018-06-24 LAB — CBC
HCT: 39.5 % (ref 39.0–52.0)
Hemoglobin: 12.3 g/dL — ABNORMAL LOW (ref 13.0–17.0)
MCH: 31.2 pg (ref 26.0–34.0)
MCHC: 31.1 g/dL (ref 30.0–36.0)
MCV: 100.3 fL — ABNORMAL HIGH (ref 80.0–100.0)
Platelets: UNDETERMINED 10*3/uL (ref 150–400)
RBC: 3.94 MIL/uL — ABNORMAL LOW (ref 4.22–5.81)
RDW: 12.7 % (ref 11.5–15.5)
WBC: 1.5 10*3/uL — ABNORMAL LOW (ref 4.0–10.5)
nRBC: 0 % (ref 0.0–0.2)

## 2018-06-24 LAB — MAGNESIUM: Magnesium: 2.8 mg/dL — ABNORMAL HIGH (ref 1.7–2.4)

## 2018-06-24 LAB — MRSA PCR SCREENING: MRSA by PCR: NEGATIVE

## 2018-06-24 LAB — GLUCOSE, CAPILLARY
Glucose-Capillary: 36 mg/dL — CL (ref 70–99)
Glucose-Capillary: 96 mg/dL (ref 70–99)

## 2018-06-24 LAB — URINE CULTURE: CULTURE: NO GROWTH

## 2018-06-24 LAB — PHOSPHORUS: Phosphorus: 3.6 mg/dL (ref 2.5–4.6)

## 2018-06-24 MED ORDER — POLYVINYL ALCOHOL 1.4 % OP SOLN: 1.0000 [drp] | Freq: Four times a day (QID) | OPHTHALMIC | Status: DC | PRN

## 2018-06-24 MED ORDER — ONDANSETRON 4 MG PO TBDP: 4.0000 mg | ORAL_TABLET | Freq: Four times a day (QID) | ORAL | Status: DC | PRN

## 2018-06-24 MED ORDER — ACETAMINOPHEN 650 MG RE SUPP: 650.0000 mg | Freq: Four times a day (QID) | RECTAL | Status: DC | PRN

## 2018-06-24 MED ORDER — SODIUM BICARBONATE 8.4 % IV SOLN
100.0000 meq | Freq: Once | INTRAVENOUS | Status: AC
  Administered 2018-06-24: 100 meq via INTRAVENOUS
  Filled 2018-06-24: qty 100

## 2018-06-24 MED ORDER — DEXTROSE 50 % IV SOLN
INTRAVENOUS | Status: AC
  Administered 2018-06-24: 50 mL
  Filled 2018-06-24: qty 50

## 2018-06-24 MED ORDER — GLYCOPYRROLATE 0.2 MG/ML IJ SOLN
0.2000 mg | INTRAMUSCULAR | Status: DC | PRN
  Administered 2018-06-24: 0.2 mg via INTRAVENOUS
  Filled 2018-06-24 (×2): qty 1

## 2018-06-24 MED ORDER — ONDANSETRON HCL 4 MG/2ML IJ SOLN: 4.0000 mg | Freq: Four times a day (QID) | INTRAMUSCULAR | Status: DC | PRN

## 2018-06-24 MED ORDER — GLYCOPYRROLATE 1 MG PO TABS: 1.0000 mg | ORAL_TABLET | ORAL | Status: DC | PRN

## 2018-06-24 MED ORDER — GLYCOPYRROLATE 0.2 MG/ML IJ SOLN: 0.2000 mg | INTRAMUSCULAR | Status: DC | PRN

## 2018-06-24 MED ORDER — MORPHINE SULFATE (PF) 4 MG/ML IV SOLN
4.0000 mg | INTRAVENOUS | Status: DC | PRN
  Administered 2018-06-24: 4 mg via INTRAVENOUS
  Filled 2018-06-24 (×2): qty 1

## 2018-06-24 MED ORDER — ACETAMINOPHEN 325 MG PO TABS: 650.0000 mg | ORAL_TABLET | Freq: Four times a day (QID) | ORAL | Status: DC | PRN

## 2018-06-24 MED FILL — Medication: Qty: 1 | Status: AC

## 2018-06-25 LAB — CULTURE, RESPIRATORY W GRAM STAIN

## 2018-06-28 ENCOUNTER — Telehealth: Payer: Self-pay | Admitting: Pulmonary Disease

## 2018-06-28 LAB — CULTURE, BLOOD (ROUTINE X 2)
Culture: NO GROWTH
Culture: NO GROWTH
Special Requests: ADEQUATE
Special Requests: ADEQUATE

## 2018-06-28 NOTE — Telephone Encounter (Signed)
06/28/18 Received DC/Burial for [name] at [place] from [employer] [place]

## 2018-06-29 DIAGNOSIS — K6389 Other specified diseases of intestine: Secondary | ICD-10-CM

## 2018-07-01 ENCOUNTER — Telehealth: Payer: Self-pay | Admitting: Pulmonary Disease

## 2018-07-01 NOTE — Telephone Encounter (Signed)
07/01/18 Called [facility] [employer] [facility] to pick up [place] by Dr. Valeta Harms.

## 2018-07-21 NOTE — Progress Notes (Addendum)
1:16pm-CSW spoke with Jenny Reichmann in [facility] and was informed that she has pt on her list and will se ept tomorrow. CSW updated RN on unit and provided pt's family with contact information for [place] at this time. CSW also made aware by RN that family is considering withdrawing care on pt at this time.   At this time there are no further CSW needs warranted. Please reconsult if new need arises.   CSW consulted verbally by RN. CSW made aware by family at bedside that pt is in need of insurance. CSW expressed that CSW would reach out to [place] with Financial Counseling for further assistance with this matter. CSW has left voicemail asking that [place] call back at this time.    Virgie Dad Steen Bisig, MSW, [place] [facility] Clinical Social Worker [phone]

## 2018-07-21 NOTE — Progress Notes (Signed)
NAME:  Christopher English, MRN:  [id number], DOB:  [birth year], LOS: 3 ADMISSION DATE:  07/11/2018, CONSULTATION DATE: 12/4 REFERRING MD:  Dr. Broadus John, CHIEF COMPLAINT:  AMS   Brief History   53 year old male with history of ETOH admitted 12/2 for abdominal pain and distention. Found to have colonic mass which will require surgery. Prior to surgery, became altered and unstable requiring ICU transfer.   History of present illness   53 year old male with PMH as below, which is significant for alcohol abuse, admitted 12/2 for complaints of abdominal pain and distention. He drinks 4, 24-ounce beers per day. Last drink was thanksgiving day before he started to feel sick, 4 days prior to my admission. 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 He was admitted with surgical consultation.  We were consulted due to altered mental status and increased work of breathing. Patient was evaluated on the floor and decided to transfer him to the unit immediately for intubation. Patient obviously not able to give no history.  Patient was intubated immediately for respiratory failure and shortly after intubation he went into bradycardia and then PEA arrest and vfib requiring shock.   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 There are multiple air and fluid-filled dilated small bowel loops secondary to this obstructive process. Recommended GI consultation for endoscopic correlation and biopsy.  Mild wall  thickening of a jejunal loops which is nonspecific. Micro Data:  Blood 12/4>>> Urine 12/4>>> Sputum 12/4>>>  Antimicrobials:  Zosyn 12/4 >>  Interim history/subjective:   Decisions made yesterday with family at bedside to plan for no escalation of care.  Discussions at bedside this morning with mother and father.  Patient has never been married and has no children.  Current decision-maker is her mother and father.  They have made the decision for withdrawal of care after discussions with surgery.  Objective   Blood pressure (!) 80/44, pulse (!) 116, temperature (!) 102.2 F (39 C), temperature source Axillary, resp. rate (!) 26, height 5' 7.5" (1.715 m), weight 69 kg, SpO2 95 %. CVP:  [8 mmHg-10 mmHg] 10 mmHg  Vent Mode: PRVC FiO2 (%):  [70 %-100 %] 70 % Set Rate:  [26 bmp] 26 bmp Vt Set:  [520 mL-530 mL] 530 mL PEEP:  [8 cmH20] 8 cmH20 Plateau Pressure:  [21 cmH20-24 cmH20] 24 cmH20   Intake/Output Summary (Last 24 hours) at 07/16/18 1244 Last data filed at Jul 16, 2018 1200 Gross per 24 hour  Intake 8520 ml  Output 985 ml  Net 7535 ml   Filed Weights   07/01/2018 2237  Weight: 69 kg    Examination: General appearance: 53 y.o., male, debated on mechanical ventilation, acutely ill Eyes: anicteric sclerae, HENT: NCAT; oropharynx, MMM Neck: Trachea midline; endotracheal tube in place Lungs: CTAB, no crackles, no wheeze CV: Tachycardic regular, S1, S2, no MRGs  Abdomen: Significantly distended Extremities: No peripheral edema, radial and DP pulses present bilaterally  Skin: Normal temperature, turgor and texture; no rash Psych: [facility] Problem  list     Assessment & Plan:  53 year old male with history of etoh abuse presenting with a colonic mass, elevated CEA and bowel obstruction who vomited, aspirated and had a cardiac arrest.  Discussed with PCCM-NP.  Ongoing refractory shock secondary to large bowel obstructed concerning for colonic mass  status post cardiac arrest now with acute hypoxemic respiratory failure multiorgan failure requiring intubation and mechanical ventilation.  Also concern for aspiration pneumonia and history of chronic alcohol abuse.  Plan: Long discussion with family at bedside.  Mother and father are current decision makers.  They understand that he has a large bowel obstruction due to a colonic mass and surgery feels that trying to take him to the operating room would take his life.    At this point they want to make sure that he is under no prolonged suffering.  He has been on maxed vasopressors overnight.  And continuing have an elevated lactic acidosis.  They have been unable to maintain mean arterial pressure for the past several hours.  At this time with discussion with the family the decision was made to transition to comfort care.  We will place orders for withdrawal of life-sustaining therapy.  Labs   CBC: Recent Labs  Lab 07/06/2018 1511 06/23/18 0610 06/23/18 0714 2018/06/30 0516  WBC 7.0 1.2* 1.8* 1.5*  NEUTROABS 5.5  --  1.2*  --   HGB 16.9 14.9 13.3 12.3*  HCT 50.2 46.8 42.8 39.5  MCV 93.5 100.0 99.8 100.3*  PLT 138* 135* 120* PLATELET CLUMPS NOTED ON SMEAR, UNABLE TO ESTIMATE    Basic Metabolic Panel: Recent Labs  Lab 07/12/2018 1511 06/22/18 0212 06/23/18 0252 06/23/18 0610 06/23/18 0714 06-30-2018 0516  NA 134* 135 136  --  141 134*  K 3.2* 4.7 3.8  --  3.0* 4.5  CL 96* 101 100  --  104 107  CO2 23 21* 21*  --  16* 18*  GLUCOSE 137* 124* 140*  --  157* 135*  BUN 19 15 19   --  23* 37*  CREATININE 1.54* 1.06 1.08  --  1.68* 2.07*  CALCIUM 9.8 9.0 8.5*  --  7.4* 5.6*  MG  --  2.5*  --  3.0* 2.9* 2.8*  PHOS  --  3.5  --   --  2.4* 3.6   GFR: Estimated Creatinine Clearance: 39.7 mL/min (A) (by C-G formula based on SCr of 2.07 mg/dL (H)). Recent Labs  Lab 07/15/2018 1511 06/23/18 0610 06/23/18 0611 06/23/18 0714 06-30-18 0516  WBC 7.0 1.2*  --  1.8* 1.5*  LATICACIDVEN  --    --  10.5* 9.7*  --     Liver Function Tests: Recent Labs  Lab 06/22/2018 1511 06/23/18 0714  AST 25 36  ALT 19 15  ALKPHOS 70 43  BILITOT 0.9 0.7  PROT 8.6* 5.5*  ALBUMIN 4.7 2.9*   Recent Labs  Lab 07/01/2018 1511  LIPASE 20   Recent Labs  Lab 06/23/18 0610  AMMONIA 54*    ABG    Component Value Date/Time   PHART 7.291 (L) 30-Jun-2018 0340   PCO2ART 39.9 2018-06-30 0340   PO2ART 76.0 (L) 2018/06/30 0340   HCO3 19.3 (L) June 30, 2018 0340   TCO2 21 (L) June 30, 2018 0340   ACIDBASEDEF 7.0 (H) 30-Jun-2018 0340   O2SAT 94.0 06/30/18 0340     Coagulation Profile: No results for input(s): INR, PROTIME in the last 168 hours.  Cardiac Enzymes: Recent Labs  Lab 06/23/18 0610 06/23/18 1610  06/23/18 1305 06/23/18 1904  TROPONINI <0.03 <0.03 0.09* 0.18*    HbA1C: No results found for: HGBA1C  CBG: Recent Labs  Lab 06/23/18 1531 06/23/18 1923 06/23/18 2321 2018/07/19 0320 07/19/18 0457  GLUCAP 101* 79 51* 36* 96   ____________________________________________________________________________________  This patient is critically ill with multiple organ system failure; which, requires frequent high complexity decision making, assessment, support, evaluation, and titration of therapies. This was completed through the application of advanced monitoring technologies and extensive interpretation of multiple databases. During this encounter critical care time was devoted to patient care services described in this note for 32 minutes.  The vast majority of this time was spent discussing transition of care to comfort measures.  As well as the process of withdrawal of care and what that would look like for the patient.  Family was appreciative of our communication and discussions.   Garner Nash, DO [facility] 07-19-2018 12:44 PM  Personal pager: [phone] If unanswered, please page CCM On-call: [phone]

## 2018-07-21 NOTE — Death Summary Note (Signed)
DEATH SUMMARY   Patient Details  Name: Christopher English MRN: [id number] DOB: 11/30/1965  Admission/Discharge Information   Admit Date:  17-Jul-2018  Date of Death: Date of Death: 20-Jul-2018  Time of Death: Time of Death: 11/08/44  Length of Stay: 3  Referring Physician: Patient, No Pcp Per   Reason(s) for Hospitalization  Intestinal Obstruction  Diagnoses  Preliminary cause of death: Intestinal obstruction ([place]) Secondary Diagnoses (including complications and co-morbidities):  Principal Problem:   Large bowel obstruction ([place]) Active Problems:   Tobacco abuse   AKI (acute kidney injury) ([place])   Hyponatremia   Thrombocytopenia ([place])   Alcohol abuse   Acute hypoxemic respiratory failure ([place])   Septic shock ([place])   Cardiac arrest ([place])   Aspiration pneumonia due to gastric secretions ([place])   Refractory shock ([place])   Palliative care encounter   Goals of care, counseling/discussion   Colonic mass   Brief Hospital Course (including significant findings, care, treatment, and services provided and events leading to death)  [name] is a 53 y.o. year old male with PMH as below, which is significant for alcohol abuse, admitted 07-18-2023 for complaints of abdominal pain and distention. He drinks 4, 24-ounce beers per day. Last drink was thanksgiving day before he started to feel sick, 4 days prior to my admission. 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 He was admitted with surgical consultation.  We were consulted due to altered mental status and increased work of breathing. Patient was evaluated on the floor and decided to transfer him to the unit immediately for intubation. Patient obviously not able to give no history.  Patient was intubated immediately for respiratory failure and shortly after intubation he went into  bradycardia and then PEA arrest and vfib requiring shock.   20-Jul-2018 patient had ongoing refractory shock secondary to large bowel obstructed concerning for colonic mass status post cardiac arrest now with acute hypoxemic respiratory failure multiorgan failure requiring intubation and mechanical ventilation.  Also concern for aspiration pneumonia and history of chronic alcohol abuse.  Long discussion with family at bedside.  Mother and father are current decision makers.  They understand that he has a large bowel obstruction due to a colonic mass and surgery feels that trying to take him to the operating room would take his life.    At this point they want to make sure that he is under no prolonged suffering.  He has been on maxed vasopressors overnight.  And continuing have an elevated lactic acidosis.  They have been unable to maintain mean arterial pressure for the past several hours.  At this time with discussion with the family the decision was made to transition to comfort care.  The patient passed away with family at bedside.   Pertinent Labs and Studies  Significant Diagnostic Studies Dg Chest 2 View  Result Date: 07-17-2018 CLINICAL DATA:  Colon cancer. EXAM: CHEST - 2 VIEW COMPARISON:  July 13, 2011 FINDINGS: The heart size and mediastinal contours are within normal limits. Both lungs are clear. The visualized skeletal structures are unremarkable. IMPRESSION: No active cardiopulmonary disease. Electronically Signed   By: Dorise Bullion III M.D   On: 07-17-2018 20:05   Dg Abd 1 View  Result Date: 06/22/2018 CLINICAL DATA:  Evaluate NG tube placement EXAM: ABDOMEN - 1 VIEW COMPARISON:  None. FINDINGS: The NG tube coils in  the lower chest with the distal tip in the region of the distal esophagus. The tube is been pulled back in the interval. IMPRESSION: The NG tube appears to coil in the distal esophagus with the distal tip located above the stomach. Recommend repositioning.  Electronically Signed   By: Dorise Bullion III M.D   On: 06/22/2018 19:05   Ct Abdomen Pelvis W Contrast  Result Date: 06/23/2018 CLINICAL DATA:  53 year old male with history of abdominal distention. Bowel obstruction. EXAM: CT ABDOMEN AND PELVIS WITH CONTRAST TECHNIQUE: Multidetector CT imaging of the abdomen and pelvis was performed using the standard protocol following bolus administration of intravenous contrast. CONTRAST:  161mL ISOVUE-370 IOPAMIDOL (ISOVUE-370) INJECTION 76% COMPARISON:  CT the abdomen and pelvis 07/08/2018. FINDINGS: Lower chest: Areas of volume loss and airspace consolidation in the lower lobes of the lungs bilaterally, concerning for aspiration pneumonia/pneumonitis. Nasogastric tube in the esophagus. Hepatobiliary: No suspicious cystic or solid hepatic lesions. No intra or extrahepatic biliary ductal dilatation. High attenuation material lying dependently in the gallbladder, new compared to the recent prior examination, presumably vicarious excretion of contrast material from the recent CT study. Pancreas: No pancreatic mass. No pancreatic ductal dilatation. No pancreatic or peripancreatic fluid or inflammatory changes. Spleen: Unremarkable. Adrenals/Urinary Tract: Bilateral kidneys and bilateral adrenal glands are normal in appearance. No hydroureteronephrosis. Urinary bladder is nearly decompressed around an indwelling Foley balloon catheter. Small amount of gas in the urinary bladder, presumably iatrogenic. Stomach/Bowel: Nasogastric tube extending into the body of the stomach, which is completely decompressed. Extensive mural thickening and mucosal hyperenhancement throughout the small bowel, new compared to the recent prior study. Borderline dilated loops of small bowel measuring up to 3 cm in diameter in the region of the jejunum. Distal small bowel is decompressed. Colon appears very mildly distended. Several air-fluid levels are noted within both the small bowel and the  colon. In the mid to distal descending colon there is again a focal area of mural thickening and mucosal hyperenhancement best appreciated on axial images 56-59 of series 3, where there is also marked luminal narrowing, concerning for small colonic neoplasm. Normal appendix. Vascular/Lymphatic: Aortic atherosclerosis, without evidence of aneurysm or dissection in the abdominal or pelvic vasculature. No lymphadenopathy noted in the abdomen or pelvis. Reproductive: Prostate gland and seminal vesicles are unremarkable in appearance. Other: Small volume of ascites in the low anatomic pelvis. No pneumoperitoneum. Musculoskeletal: There are no aggressive appearing lytic or blastic lesions noted in the visualized portions of the skeleton. IMPRESSION: 1. Compared to the recent prior study, there has been interval development of widespread mural thickening and mucosal hyperenhancement most evident throughout the mid small bowel, concerning for developing enteritis. 2. Persistent focal narrowing of the mid to distal descending colon where there is also mural thickening and mucosal hyperenhancement, concerning for potential colonic neoplasm. Further evaluation with nonemergent colonoscopy is strongly recommended in the near future. Given the borderline dilatation of the colon proximal to this and several borderline dilated loops of small bowel with multiple air-fluid levels, this may be causing mild obstruction (with relative decompression at this time secondary to the indwelling nasogastric tube). 3. Interval development of a combination of consolidative changes and volume loss in the lower lobes of the lungs bilaterally, highly concerning for aspiration pneumonia/pneumonitis. 4. Aortic atherosclerosis. Electronically Signed   By: Vinnie Langton M.D.   On: 06/23/2018 13:02   Ct Abdomen Pelvis W Contrast  Result Date: 07/20/2018 CLINICAL DATA:  Abdominal pain and distention with nausea and vomiting as  well as  constipation 4 days. EXAM: CT ABDOMEN AND PELVIS WITH CONTRAST TECHNIQUE: Multidetector CT imaging of the abdomen and pelvis was performed using the standard protocol following bolus administration of intravenous contrast. CONTRAST:  138mL OMNIPAQUE IOHEXOL 300 MG/ML  SOLN COMPARISON:  07/10/2011 FINDINGS: Lower chest: Unremarkable. Hepatobiliary: Liver, gallbladder and biliary tree are normal. Pancreas: Normal. Spleen: Normal Adrenals/Urinary Tract: Adrenal glands are normal. Kidneys are normal in size without hydronephrosis or nephrolithiasis. Ureters and bladder are normal. Stomach/Bowel: Stomach is normal. Proximal small bowel demonstrates evidence of mild wall thickening with multiple air and fluid-filled dilated mid to distal small bowel loops measuring up to 3.5 cm in diameter. This dilated small bowel extends to the ileocecal valve. There is air and fluid-filled mildly prominent colon to the level of the mid descending colon where there is focal narrowing and abnormal soft tissue density. This may represent a constricting neoplastic process. There is air and stool within the colon distal to this focal stricture. Vascular/Lymphatic: Vascular structures are unremarkable. No adenopathy. Reproductive: Normal. Other: No significant free fluid or focal inflammatory change. Musculoskeletal: Normal. 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 There are multiple air and fluid-filled dilated small bowel loops secondary to this obstructive process. Recommended GI consultation for endoscopic correlation and biopsy. Mild wall thickening of a jejunal loops which is nonspecific. Electronically Signed   By: Marin Olp M.D.   On: 06/23/2018 17:32   Dg Chest Port 1 View  Result Date: 30-Jun-2018 CLINICAL DATA:  Check endotracheal tube placement EXAM: PORTABLE CHEST 1 VIEW  COMPARISON:  06/23/2018 FINDINGS: Endotracheal tube, nasogastric catheter and left jugular central line are again seen and stable. Cardiac shadow is stable. Left lung remains clear. Increasing opacity in the right lung is noted with associated increasing effusion. IMPRESSION: Increasing opacity in the right lung with increasing effusion. This may be related to asymmetric edema or increased infectious infiltrate. Electronically Signed   By: Inez Catalina M.D.   On: June 30, 2018 08:47   Dg Chest Port 1 View  Result Date: 06/23/2018 CLINICAL DATA:  Status post central line placement today. EXAM: PORTABLE CHEST 1 VIEW COMPARISON:  Single-view of the chest earlier today. FINDINGS: New left IJ approach central venous catheter is in place with the tip in the mid to lower superior vena cava. No pneumothorax. ETT and NG tube are unchanged. Mild right basilar atelectasis noted. No pneumothorax. Heart size is normal. Defibrillator pads are in place. IMPRESSION: Right IJ catheter tip projects in the mid to lower superior vena cava. No pneumothorax. ETT and NG tube in good position. Mild bibasilar atelectasis. Electronically Signed   By: Inge Rise M.D.   On: 06/23/2018 10:12   Portable Chest X-ray  Result Date: 06/23/2018 CLINICAL DATA:  Endotracheal tube placement EXAM: PORTABLE CHEST 1 VIEW COMPARISON:  06/20/2018 FINDINGS: Endotracheal tube and nasogastric catheter are now seen in satisfactory position. The cardiac shadow is stable. The lungs are again well aerated with minimal right basilar atelectasis. No pneumothorax is seen. IMPRESSION: New right basilar atelectasis. Endotracheal tube and nasogastric catheter in satisfactory position. Electronically Signed   By: Inez Catalina M.D.   On: 06/23/2018 08:06   Dg Abd Portable 1 View  Result Date: 06/23/2018 CLINICAL DATA:  Post NG tube placement. EXAM: PORTABLE ABDOMEN - 1 VIEW COMPARISON:  Same day CT abdomen and pelvis FINDINGS: The side port of a  gastric tube  is seen at the GE junction and further advancement by at least 3 cm is recommended. Dilated small and large bowel loops are identified to the mid descending colon similar in appearance to the CT findings. The diffusely dilated small bowel loops measure up to 3.9 cm in caliber slightly prominent than on CT. Given dilatation large bowel to the level of the cecum, findings are likely related to incompetence of the ileocecal valve. No free air is identified. IMPRESSION: 1. New gastric tube in place with side port at the GE junction. Further advancement of the tube is suggested by at least 3 cm. 2. Gaseous distention of small and large bowel loops as above. Electronically Signed   By: Ashley Royalty M.D.   On: 07/07/2018 23:18    Microbiology Recent Results (from the past 240 hour(s))  Culture, blood (Routine X 2) w Reflex to ID Panel     Status: None   Collection Time: 06/23/18 11:34 AM  Result Value Ref Range Status   Specimen Description BLOOD LEFT HAND  Final   Special Requests   Final    BOTTLES DRAWN AEROBIC ONLY Blood Culture adequate volume   Culture   Final    NO GROWTH 5 DAYS Performed at [facility], [place] [address] [place], [place] [address]    Report Status 06/28/2018 FINAL  Final  Culture, respiratory (non-expectorated)     Status: None   Collection Time: 06/23/18 11:42 AM  Result Value Ref Range Status   Specimen Description TRACHEAL ASPIRATE  Final   Special Requests NONE  Final   Gram Stain   Final    RARE WBC PRESENT,BOTH PMN AND MONONUCLEAR MODERATE GRAM NEGATIVE RODS FEW GRAM POSITIVE RODS FEW GRAM POSITIVE COCCI Performed at [facility], [place] [address], [place], [place] [address]    Culture ABUNDANT ESCHERICHIA COLI  Final   Report Status 06/25/2018 FINAL  Final   Organism ID, Bacteria ESCHERICHIA COLI  Final      Susceptibility   Escherichia coli - MIC*    AMPICILLIN <=2 SENSITIVE Sensitive     CEFAZOLIN <=4 SENSITIVE Sensitive     CEFEPIME <=1 SENSITIVE  Sensitive     CEFTAZIDIME <=1 SENSITIVE Sensitive     CEFTRIAXONE <=1 SENSITIVE Sensitive     CIPROFLOXACIN <=0.25 SENSITIVE Sensitive     GENTAMICIN <=1 SENSITIVE Sensitive     IMIPENEM <=0.25 SENSITIVE Sensitive     TRIMETH/SULFA <=20 SENSITIVE Sensitive     AMPICILLIN/SULBACTAM <=2 SENSITIVE Sensitive     PIP/TAZO <=4 SENSITIVE Sensitive     Extended ESBL NEGATIVE Sensitive     * ABUNDANT ESCHERICHIA COLI  Culture, blood (Routine X 2) w Reflex to ID Panel     Status: None   Collection Time: 06/23/18 11:57 AM  Result Value Ref Range Status   Specimen Description BLOOD RIGHT HAND  Final   Special Requests   Final    BOTTLES DRAWN AEROBIC ONLY Blood Culture adequate volume   Culture   Final    NO GROWTH 5 DAYS Performed at [facility], [place] [address], [place], [place] [address]    Report Status 06/28/2018 FINAL  Final  Urine Culture     Status: None   Collection Time: 06/23/18  1:20 PM  Result Value Ref Range Status   Specimen Description URINE, CATHETERIZED  Final   Special Requests NONE  Final   Culture   Final    NO GROWTH Performed at  [facility], [place] [address], [place], [place] [address]    Report Status 07/09/18 FINAL  Final  MRSA PCR Screening     Status: None   Collection Time: July 09, 2018  9:54 AM  Result Value Ref Range Status   MRSA by PCR NEGATIVE NEGATIVE Final    Comment:        The GeneXpert MRSA Assay (FDA approved for NASAL specimens only), is one component of a comprehensive MRSA colonization surveillance program. It is not intended to diagnose MRSA infection nor to guide or monitor treatment for MRSA infections. Performed at [facility], [place] [address], [place], [place] [address]     Lab Basic Metabolic Panel: Recent Labs  Lab 06/23/18 0252 06/23/18 0610 06/23/18 0714 07/09/2018 0516  NA 136  --  141 134*  K 3.8  --  3.0* 4.5  CL 100  --  104 107  CO2 21*  --  16* 18*  GLUCOSE 140*  --  157* 135*  BUN 19  --  23* 37*   CREATININE 1.08  --  1.68* 2.07*  CALCIUM 8.5*  --  7.4* 5.6*  MG  --  3.0* 2.9* 2.8*  PHOS  --   --  2.4* 3.6   Liver Function Tests: Recent Labs  Lab 06/23/18 0714  AST 36  ALT 15  ALKPHOS 43  BILITOT 0.7  PROT 5.5*  ALBUMIN 2.9*   No results for input(s): LIPASE, AMYLASE in the last 168 hours. Recent Labs  Lab 06/23/18 0610  AMMONIA 54*   CBC: Recent Labs  Lab 06/23/18 0610 06/23/18 0714 2018/07/09 0516  WBC 1.2* 1.8* 1.5*  NEUTROABS  --  1.2*  --   HGB 14.9 13.3 12.3*  HCT 46.8 42.8 39.5  MCV 100.0 99.8 100.3*  PLT 135* 120* PLATELET CLUMPS NOTED ON SMEAR, UNABLE TO ESTIMATE   Cardiac Enzymes: Recent Labs  Lab 06/23/18 0610 06/23/18 0714 06/23/18 1305 06/23/18 1904  TROPONINI <0.03 <0.03 0.09* 0.18*   Sepsis Labs: Recent Labs  Lab 06/23/18 0610 06/23/18 0611 06/23/18 0714 07/09/2018 0516  WBC 1.2*  --  1.8* 1.5*  LATICACIDVEN  --  10.5* 9.7*  --     Procedures/Operations   Intubation on 06/23/2018 Cardiac arrest 12/4   Leory Plowman L [name] 06/29/2018, 6:14 PM

## 2018-07-21 NOTE — Progress Notes (Signed)
Pt extubated per withdraw order.  Family and RN at bedside.

## 2018-07-21 NOTE — Progress Notes (Signed)
RT note: patient does not meet wean criteria for daily SBT due to increased FIO2 of 80%.  Will continue to monitor and wean FIO2 as tolerated.

## 2018-07-21 NOTE — Progress Notes (Signed)
Difficult circumstances.  Had prayer with family for peace in hearts and minds -just parents present at that time.  Came back later and 2 others were present with parents.  They had called their pastor to come.  Chaplain let them know they can have chaplain return if they want to.  Conard Novak, Chaplain    06/26/18 1400  Clinical Encounter Type  Visited With Patient and family together  Visit Type Follow-up;Spiritual support  Referral From Physician  Consult/Referral To Chaplain  Spiritual Encounters  Spiritual Needs Prayer;Emotional  Stress Factors  Patient Stress Factors None identified  Family Stress Factors Other (Comment) (Family is coping pretty well considering the circumstances. )

## 2018-07-21 NOTE — Progress Notes (Signed)
PATIENT DEATH NOTE  Patient asystole on monitor. Verifeid with Lucile Crater RN- absent pulses, heart sounds, Respirations, pupils fixed and dilated.   Family at bedside. Chaplain services offered.   TOD 1546. CDS and MD notified.

## 2018-07-21 NOTE — Progress Notes (Addendum)
[facility]/Trauma Progress Note      Assessment/Plan Tobacco abuse Alcohol abuse - on CIWA  Obstructing descending colon mass - GI recommends surgery and not scope, they have signed off - CEA 9.4 - pt will need a partial colectomy this admission with likely colostomy timing dependant on pt's stability - continue OG to LIWS - CT scan 12/04 showed widespread mural thickening of the mid small bowel concerning for enteritis  Acute respiratory failure and cardiac arrest 12/04 - required intubation, ICU, on pressors  FEN:OGT, NPO, IVF VTE: SCD's,heparin TG:GYIRS 12/04>> Foley:none Follow up:TBD  We will sign off for now. If pt becomes stable enough for surgery please page [place].    LOS: 3 days    Subjective: CC: colon mass  Pt is sedated and intubated. Spoke with nurse. No family at bedside.   Objective: Vital signs in last 24 hours: Temp:  [97.8 F (36.6 C)-102.1 F (38.9 C)] 102.1 F (38.9 C) (12/05 0740) Pulse Rate:  [105-141] 117 (12/05 0700) Resp:  [23-34] 26 (12/05 0700) BP: (59-135)/(41-111) 68/41 (12/05 0736) SpO2:  [91 %-99 %] 98 % (12/05 0700) Arterial Line BP: (52-157)/(30-91) 71/44 (12/05 0715) FiO2 (%):  [80 %-100 %] 80 % (12/05 0736) Last BM Date: 06/17/18  Intake/Output from previous day: 12/04 0701 - 12/05 0700 In: 7520.5 [I.V.:5817.7; IV Piggyback:1702.9] Out: 1050 [Urine:980; Emesis/NG output:70] Intake/Output this shift: No intake/output data recorded.  PE: Gen:  sedated Card:  tachy Pulm:  course breath sounds, on vent Abd: Soft, distended, no BS appreciated Skin: no rashes noted, warm and dry  Anti-infectives: Anti-infectives (From admission, onward)   Start     Dose/Rate Route Frequency Ordered Stop   2018/07/09 0000  piperacillin-tazobactam (ZOSYN) IVPB 3.375 g     3.375 g 12.5 mL/hr over 240 Minutes Intravenous Every 8 hours 06/23/18 1645     06/23/18 0730  piperacillin-tazobactam (ZOSYN) IVPB 3.375 g  Status:   Discontinued     3.375 g 12.5 mL/hr over 240 Minutes Intravenous Every 8 hours 06/23/18 0717 06/23/18 1645      Lab Results:  Recent Labs    06/23/18 0714 07-09-18 0516  WBC 1.8* 1.5*  HGB 13.3 12.3*  HCT 42.8 39.5  PLT 120* PLATELET CLUMPS NOTED ON SMEAR, UNABLE TO ESTIMATE   BMET Recent Labs    06/23/18 0714 07/09/18 0516  NA 141 134*  K 3.0* 4.5  CL 104 107  CO2 16* 18*  GLUCOSE 157* 135*  BUN 23* 37*  CREATININE 1.68* 2.07*  CALCIUM 7.4* 5.6*   PT/INR No results for input(s): LABPROT, INR in the last 72 hours. CMP     Component Value Date/Time   NA 134 (L) 07-09-2018 0516   K 4.5 07/09/18 0516   CL 107 07-09-18 0516   CO2 18 (L) 07/09/2018 0516   GLUCOSE 135 (H) 2018/07/09 0516   BUN 37 (H) 07/09/18 0516   CREATININE 2.07 (H) 2018-07-09 0516   CALCIUM 5.6 (LL) July 09, 2018 0516   PROT 5.5 (L) 06/23/2018 0714   ALBUMIN 2.9 (L) 06/23/2018 0714   AST 36 06/23/2018 0714   ALT 15 06/23/2018 0714   ALKPHOS 43 06/23/2018 0714   BILITOT 0.7 06/23/2018 0714   GFRNONAA 36 (L) 09-Jul-2018 0516   GFRAA 41 (L) 2018/07/09 0516   Lipase     Component Value Date/Time   LIPASE 20 07/20/2018 1511    Studies/Results: Dg Abd 1 View  Result Date: 06/22/2018 CLINICAL DATA:  Evaluate NG tube placement EXAM: ABDOMEN -  1 VIEW COMPARISON:  None. FINDINGS: The NG tube coils in the lower chest with the distal tip in the region of the distal esophagus. The tube is been pulled back in the interval. IMPRESSION: The NG tube appears to coil in the distal esophagus with the distal tip located above the stomach. Recommend repositioning. Electronically Signed   By: Dorise Bullion III M.D   On: 06/22/2018 19:05   Ct Abdomen Pelvis W Contrast  Result Date: 06/23/2018 CLINICAL DATA:  53 year old male with history of abdominal distention. Bowel obstruction. EXAM: CT ABDOMEN AND PELVIS WITH CONTRAST TECHNIQUE: Multidetector CT imaging of the abdomen and pelvis was performed using the  standard protocol following bolus administration of intravenous contrast. CONTRAST:  166mL ISOVUE-370 IOPAMIDOL (ISOVUE-370) INJECTION 76% COMPARISON:  CT the abdomen and pelvis 07/09/2018. FINDINGS: Lower chest: Areas of volume loss and airspace consolidation in the lower lobes of the lungs bilaterally, concerning for aspiration pneumonia/pneumonitis. Nasogastric tube in the esophagus. Hepatobiliary: No suspicious cystic or solid hepatic lesions. No intra or extrahepatic biliary ductal dilatation. High attenuation material lying dependently in the gallbladder, new compared to the recent prior examination, presumably vicarious excretion of contrast material from the recent CT study. Pancreas: No pancreatic mass. No pancreatic ductal dilatation. No pancreatic or peripancreatic fluid or inflammatory changes. Spleen: Unremarkable. Adrenals/Urinary Tract: Bilateral kidneys and bilateral adrenal glands are normal in appearance. No hydroureteronephrosis. Urinary bladder is nearly decompressed around an indwelling Foley balloon catheter. Small amount of gas in the urinary bladder, presumably iatrogenic. Stomach/Bowel: Nasogastric tube extending into the body of the stomach, which is completely decompressed. Extensive mural thickening and mucosal hyperenhancement throughout the small bowel, new compared to the recent prior study. Borderline dilated loops of small bowel measuring up to 3 cm in diameter in the region of the jejunum. Distal small bowel is decompressed. Colon appears very mildly distended. Several air-fluid levels are noted within both the small bowel and the colon. In the mid to distal descending colon there is again a focal area of mural thickening and mucosal hyperenhancement best appreciated on axial images 56-59 of series 3, where there is also marked luminal narrowing, concerning for small colonic neoplasm. Normal appendix. Vascular/Lymphatic: Aortic atherosclerosis, without evidence of aneurysm or  dissection in the abdominal or pelvic vasculature. No lymphadenopathy noted in the abdomen or pelvis. Reproductive: Prostate gland and seminal vesicles are unremarkable in appearance. Other: Small volume of ascites in the low anatomic pelvis. No pneumoperitoneum. Musculoskeletal: There are no aggressive appearing lytic or blastic lesions noted in the visualized portions of the skeleton. IMPRESSION: 1. Compared to the recent prior study, there has been interval development of widespread mural thickening and mucosal hyperenhancement most evident throughout the mid small bowel, concerning for developing enteritis. 2. Persistent focal narrowing of the mid to distal descending colon where there is also mural thickening and mucosal hyperenhancement, concerning for potential colonic neoplasm. Further evaluation with nonemergent colonoscopy is strongly recommended in the near future. Given the borderline dilatation of the colon proximal to this and several borderline dilated loops of small bowel with multiple air-fluid levels, this may be causing mild obstruction (with relative decompression at this time secondary to the indwelling nasogastric tube). 3. Interval development of a combination of consolidative changes and volume loss in the lower lobes of the lungs bilaterally, highly concerning for aspiration pneumonia/pneumonitis. 4. Aortic atherosclerosis. Electronically Signed   By: Vinnie Langton M.D.   On: 06/23/2018 13:02   Dg Chest Port 1 View  Result Date: 06/23/2018 CLINICAL  DATA:  Status post central line placement today. EXAM: PORTABLE CHEST 1 VIEW COMPARISON:  Single-view of the chest earlier today. FINDINGS: New left IJ approach central venous catheter is in place with the tip in the mid to lower superior vena cava. No pneumothorax. ETT and NG tube are unchanged. Mild right basilar atelectasis noted. No pneumothorax. Heart size is normal. Defibrillator pads are in place. IMPRESSION: Right IJ catheter tip  projects in the mid to lower superior vena cava. No pneumothorax. ETT and NG tube in good position. Mild bibasilar atelectasis. Electronically Signed   By: Inge Rise M.D.   On: 06/23/2018 10:12   Portable Chest X-ray  Result Date: 06/23/2018 CLINICAL DATA:  Endotracheal tube placement EXAM: PORTABLE CHEST 1 VIEW COMPARISON:  06/27/2018 FINDINGS: Endotracheal tube and nasogastric catheter are now seen in satisfactory position. The cardiac shadow is stable. The lungs are again well aerated with minimal right basilar atelectasis. No pneumothorax is seen. IMPRESSION: New right basilar atelectasis. Endotracheal tube and nasogastric catheter in satisfactory position. Electronically Signed   By: Inez Catalina M.D.   On: 06/23/2018 08:06      Kalman Drape , [facility] Surgery 07-19-2018, 7:42 AM  Pager: [phone] Mon-Wed, Friday 7:00am-4:30pm Thurs 7am-11:30am  Consults: [phone]

## 2018-07-21 NOTE — Progress Notes (Signed)
Family made patient comfort care and patient extubated. Fentanyl infusion for comfort. And 4mg  IV Morphine given.  Pt in no respiratory distress. Chaplain services offered to family at bed side. Emotional support given to family.

## 2018-07-21 NOTE — Progress Notes (Signed)
Shared Critical Value of Ca 5.6 with Marya Amsler, RN, pt primary RN

## 2018-07-21 NOTE — Progress Notes (Signed)
eLink Physician-Brief Progress Note Patient Name: Christopher English DOB: 12/04/1965 MRN: [id number]   Date of Service  07/03/18  HPI/Events of Note  Metabolic acidosis likely related to hypoperfusion.   eICU Interventions  Sodium bicarbonate 100 meq iv bolus as a temporizing measure pending additional discussions with patient's family regarding limited therapeutic options at this point.        Kerry Kass Christopher English 07/03/18, 5:04 AM

## 2018-07-21 NOTE — Accreditation Note (Signed)
Restraints not reported to CMS Pursuant to regulation 482.13 (G) (3) use of soft wrist restraints. 

## 2018-07-21 DEATH — deceased

## 2019-08-11 IMAGING — CT CT ABD-PELV W/ CM
2 of 5 series · 16 of 46 positions shown, 18 images · IV contrast (APPLIED)
Comparison: 07/10/2011

CLINICAL DATA: Abdominal pain and distention with nausea and
vomiting as well as constipation 4 days.

EXAM:
CT ABDOMEN AND PELVIS WITH CONTRAST
TECHNIQUE: Multidetector CT imaging of the abdomen and pelvis was performed
using the standard protocol following bolus administration of
intravenous contrast.
CONTRAST:  100mL OMNIPAQUE IOHEXOL 300 MG/ML  SOLN

[Series 3: abdomen 5.0 · axial · 0.74mm/px · z∈[-415,-35]mm · 13 of 88 slices shown, 15 images]
[im 6/88  soft-tissue]
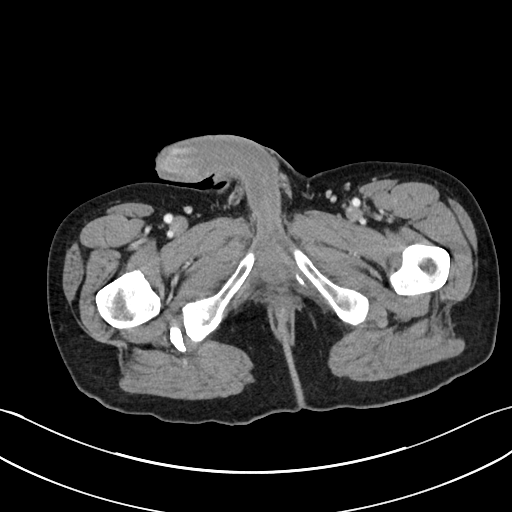
[im 6/88  bone]
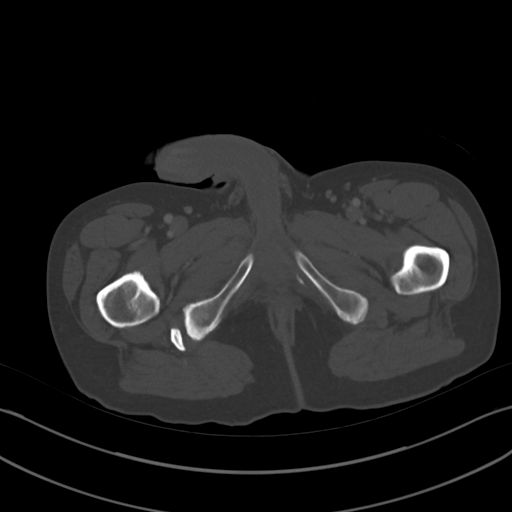
[im 11/88  soft-tissue]
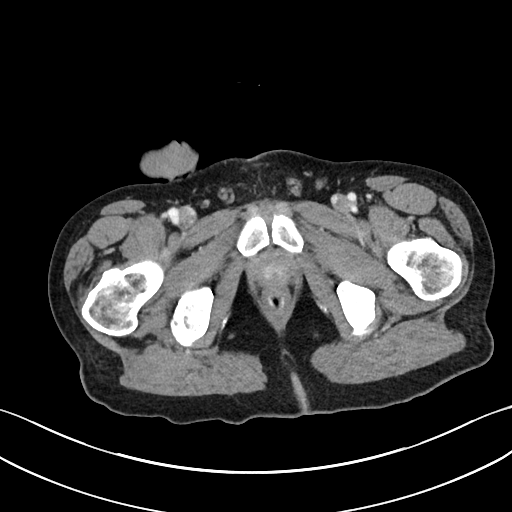
[im 21/88  soft-tissue]
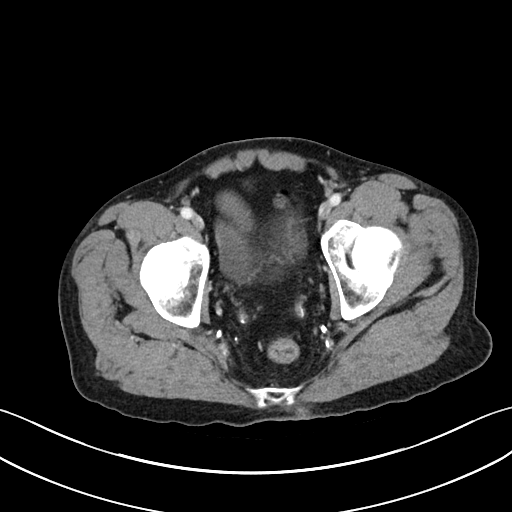
[im 26/88  soft-tissue]
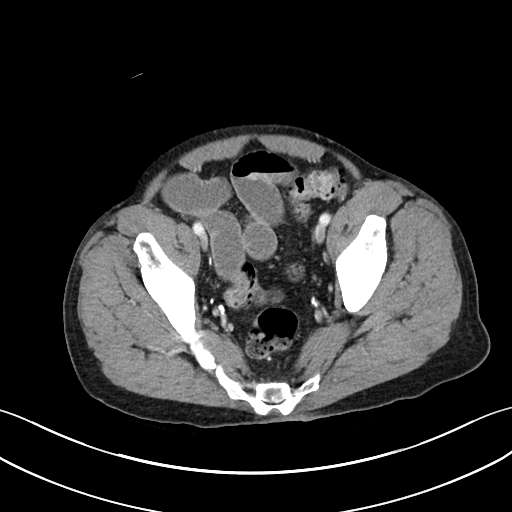
[im 31/88  soft-tissue]
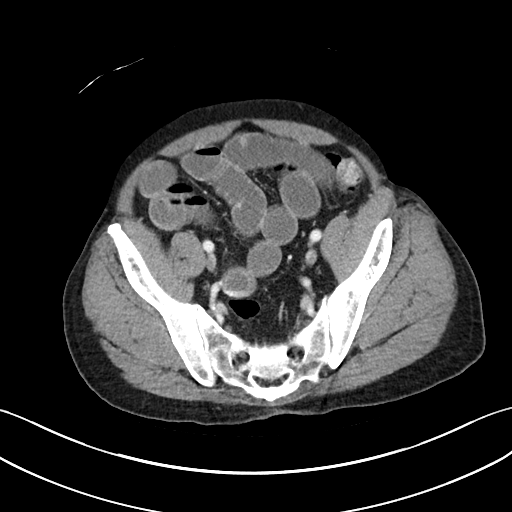
[im 36/88  soft-tissue]
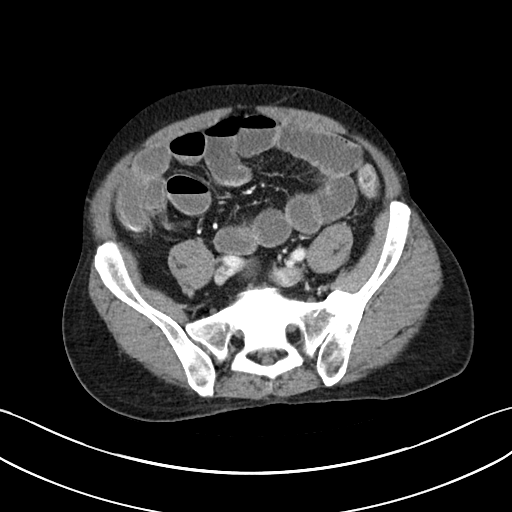
[im 47/88  soft-tissue]
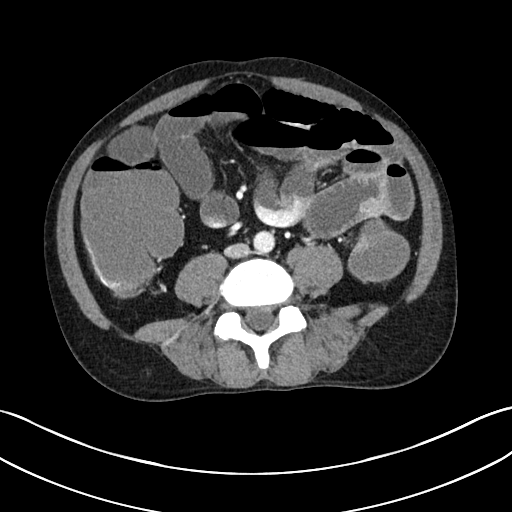
[im 52/88  soft-tissue]
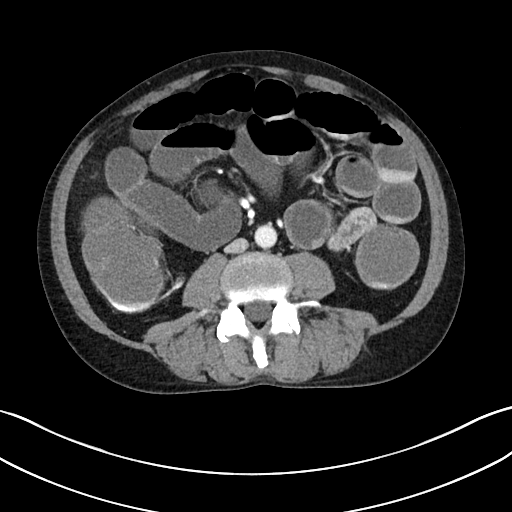
[im 57/88  soft-tissue]
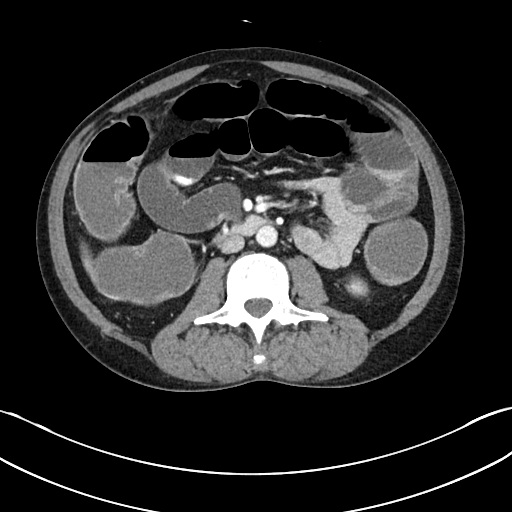
[im 57/88  bone]
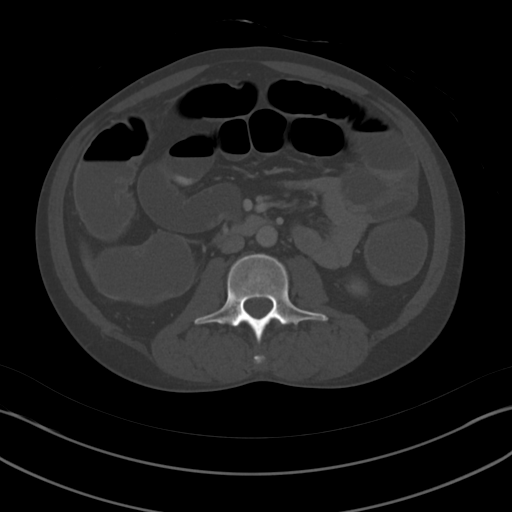
[im 62/88  soft-tissue]
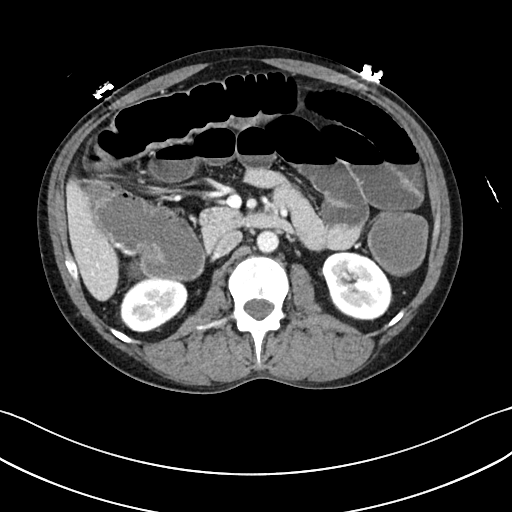
[im 67/88  soft-tissue]
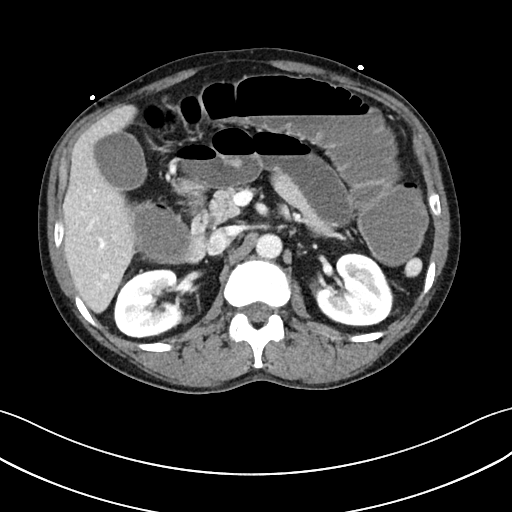
[im 77/88  soft-tissue]
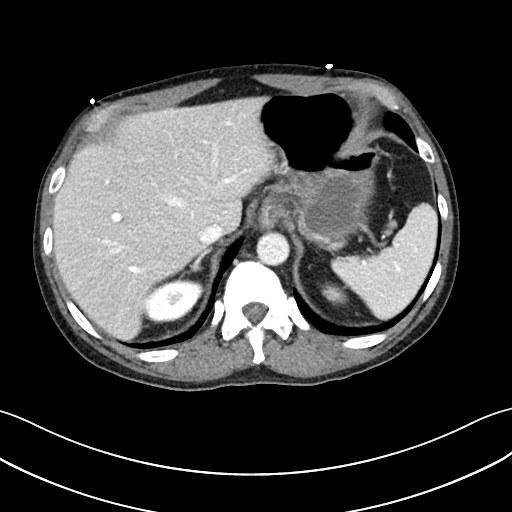
[im 82/88  soft-tissue]
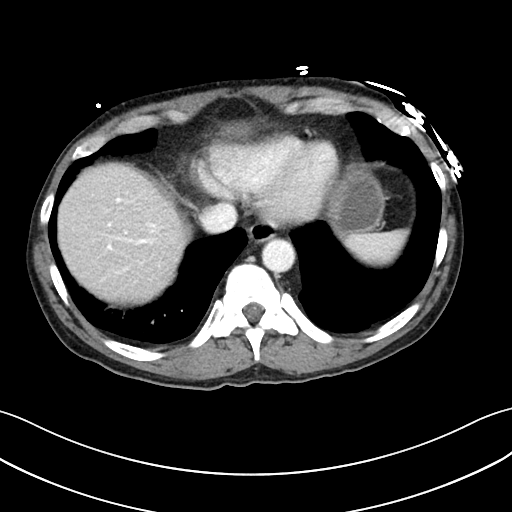

[Series 6: abdomen 3.0 mpr cor · coronal · 0.65mm/px · 3 of 91 slices shown]
[im 31/91  soft-tissue]
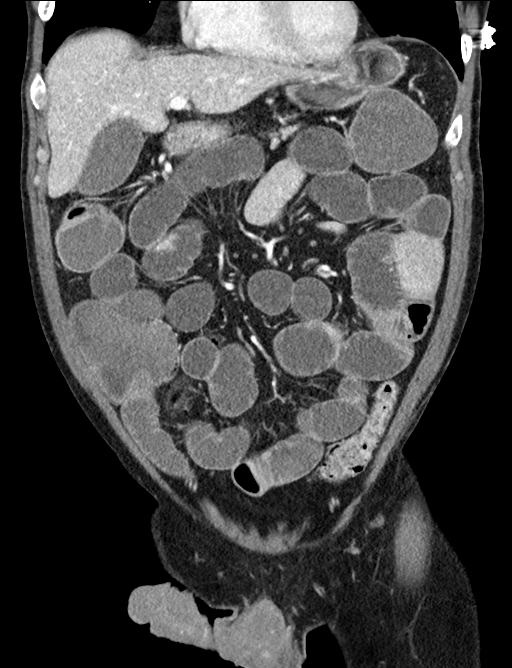
[im 41/91  soft-tissue]
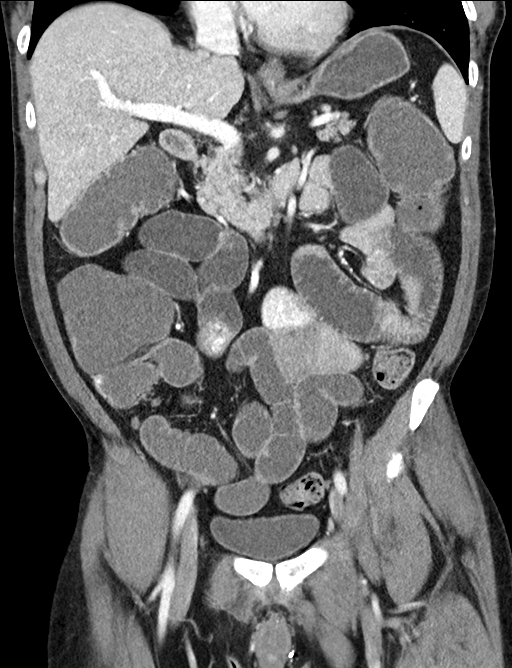
[im 51/91  soft-tissue]
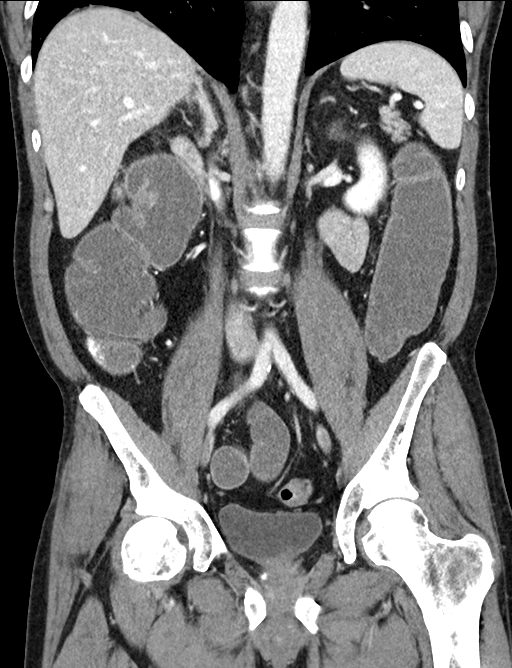

[16 of 46 positions shown; findings below may reference images not displayed]

FINDINGS: Lower chest: Unremarkable.

Hepatobiliary: Liver, gallbladder and biliary tree are normal.

Pancreas: Normal.

Spleen: Normal

Adrenals/Urinary Tract: Adrenal glands are normal. Kidneys are
normal in size without hydronephrosis or nephrolithiasis. Ureters
and bladder are normal.

Stomach/Bowel: Stomach is normal. Proximal small bowel demonstrates
evidence of mild wall thickening with multiple air and fluid-filled
dilated mid to distal small bowel loops measuring up to 3.5 cm in
diameter. This dilated small bowel extends to the ileocecal valve.
There is air and fluid-filled mildly prominent colon to the level of
the mid descending colon where there is focal narrowing and abnormal
soft tissue density. This may represent a constricting neoplastic
process. There is air and stool within the colon distal to this
focal stricture.

Vascular/Lymphatic: Vascular structures are unremarkable. No
adenopathy.

Reproductive: Normal.

Other: No significant free fluid or focal inflammatory change.

Musculoskeletal: Normal.
IMPRESSION: Fluid and air-filled prominent colon to the level of the mid
descending colon with there is an irregular mass causing moderate
focal stricture likely primary colonic neoplasm. There are multiple
air and fluid-filled dilated small bowel loops secondary to this
obstructive process. Recommended GI consultation for endoscopic
correlation and biopsy.

Mild wall thickening of a jejunal loops which is nonspecific.

## 2019-08-11 IMAGING — DX DG CHEST 2V
2 series · 2 of 2 positions shown · non-contrast
Comparison: July 13, 2011

CLINICAL DATA: Colon cancer.

EXAM:
CHEST - 2 VIEW

[w chest lat]
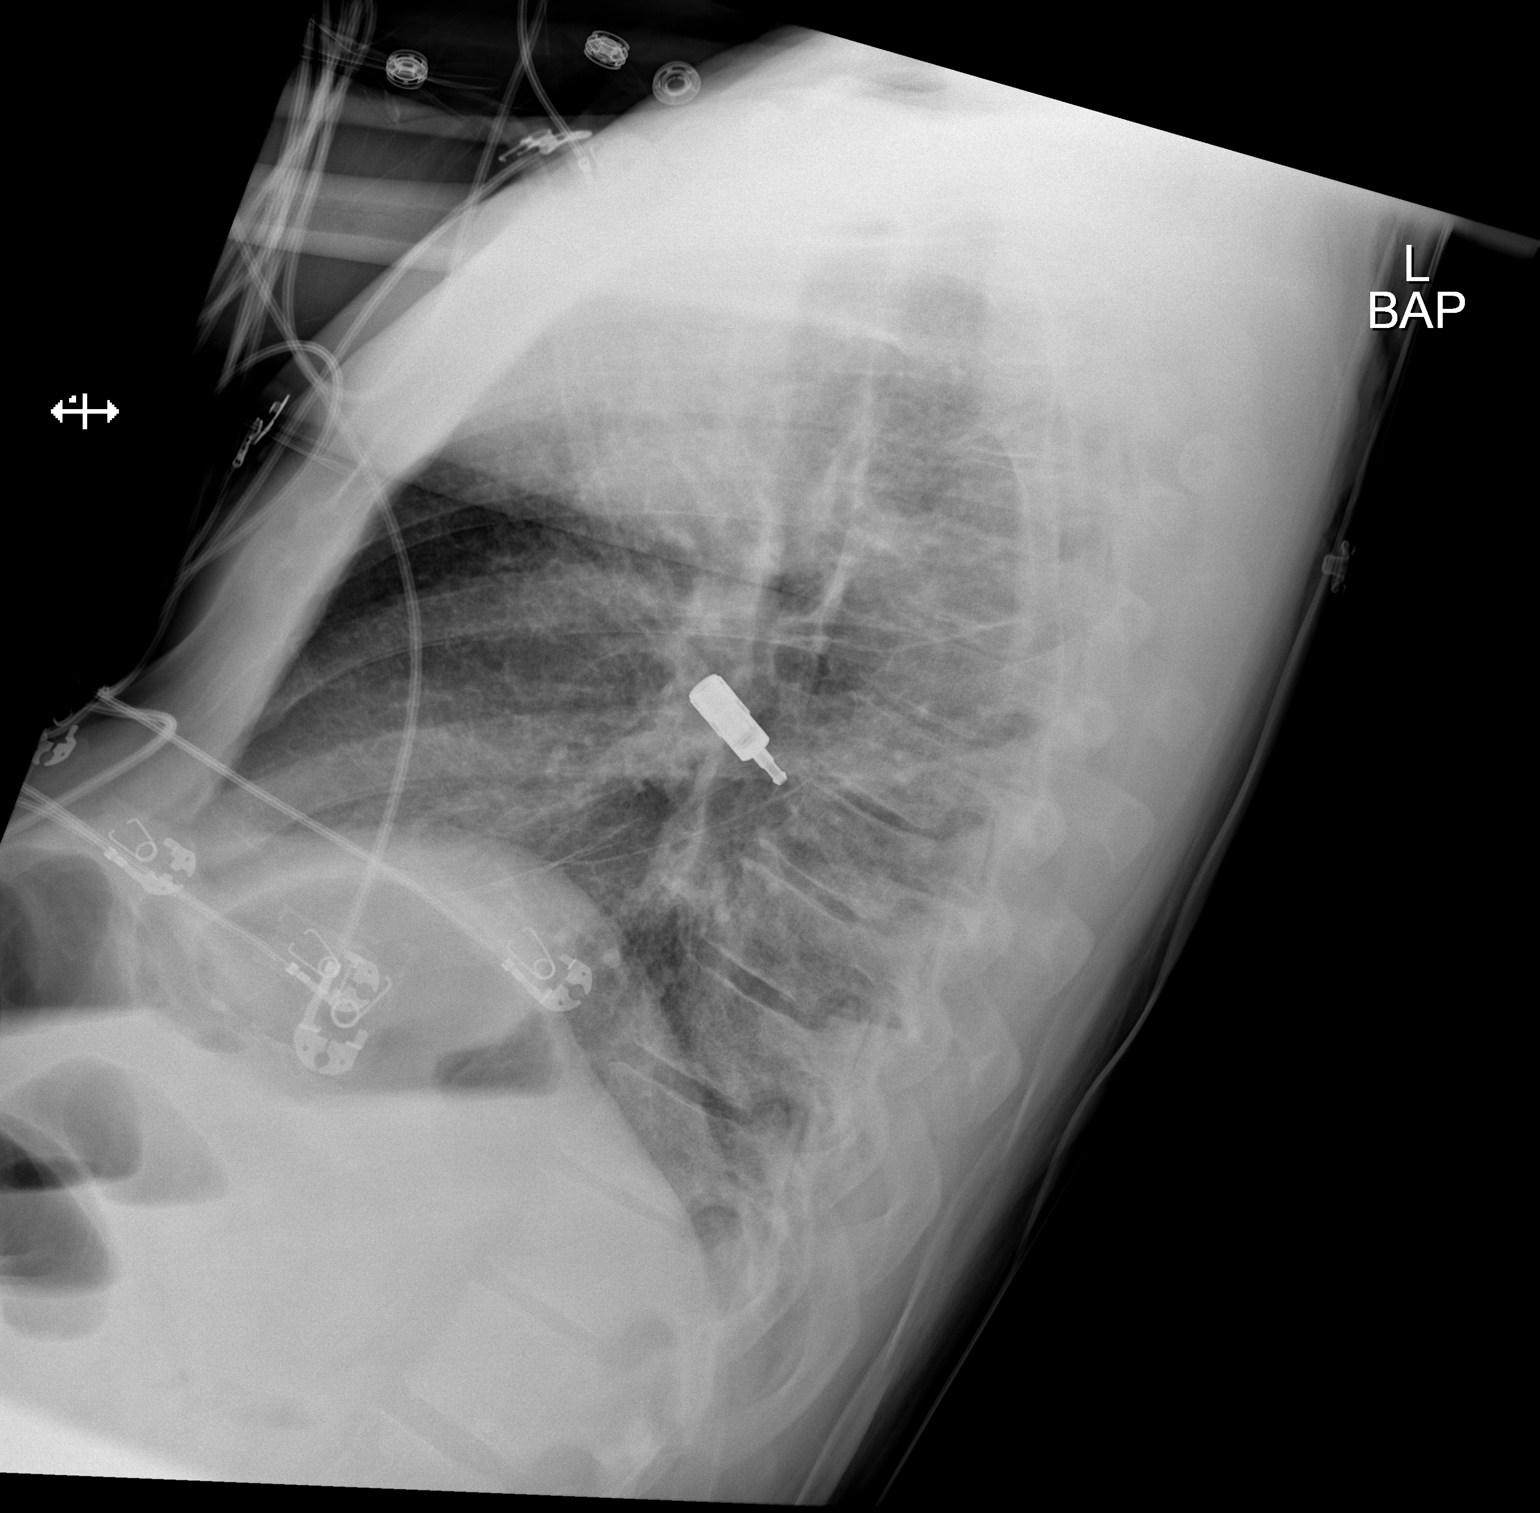

[x chest ap]
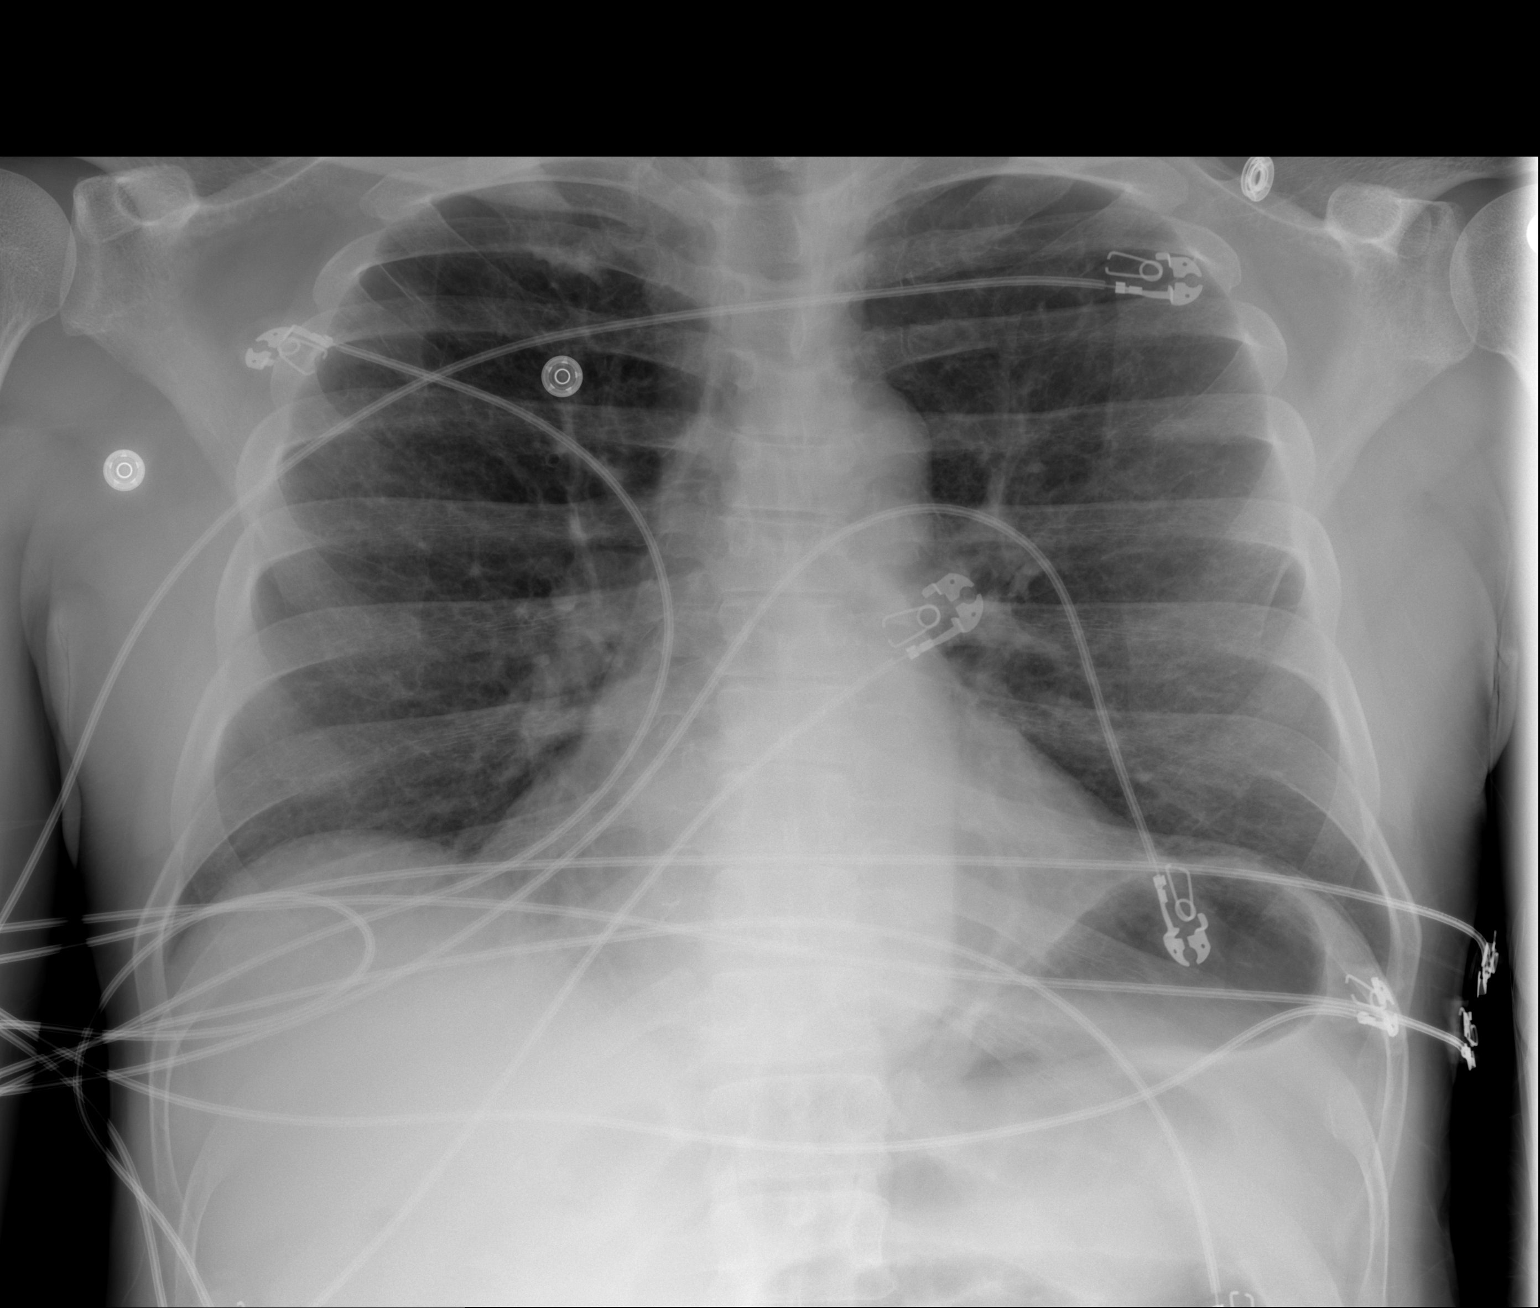

[2 of 2 positions shown; findings below may reference images not displayed]

FINDINGS: The heart size and mediastinal contours are within normal limits.
Both lungs are clear. The visualized skeletal structures are
unremarkable.
IMPRESSION: No active cardiopulmonary disease.

## 2019-08-14 IMAGING — DX DG CHEST 1V PORT
1 series · 1 of 1 positions shown · non-contrast
Comparison: 06/23/2018

CLINICAL DATA: Check endotracheal tube placement

EXAM:
PORTABLE CHEST 1 VIEW

[chest]
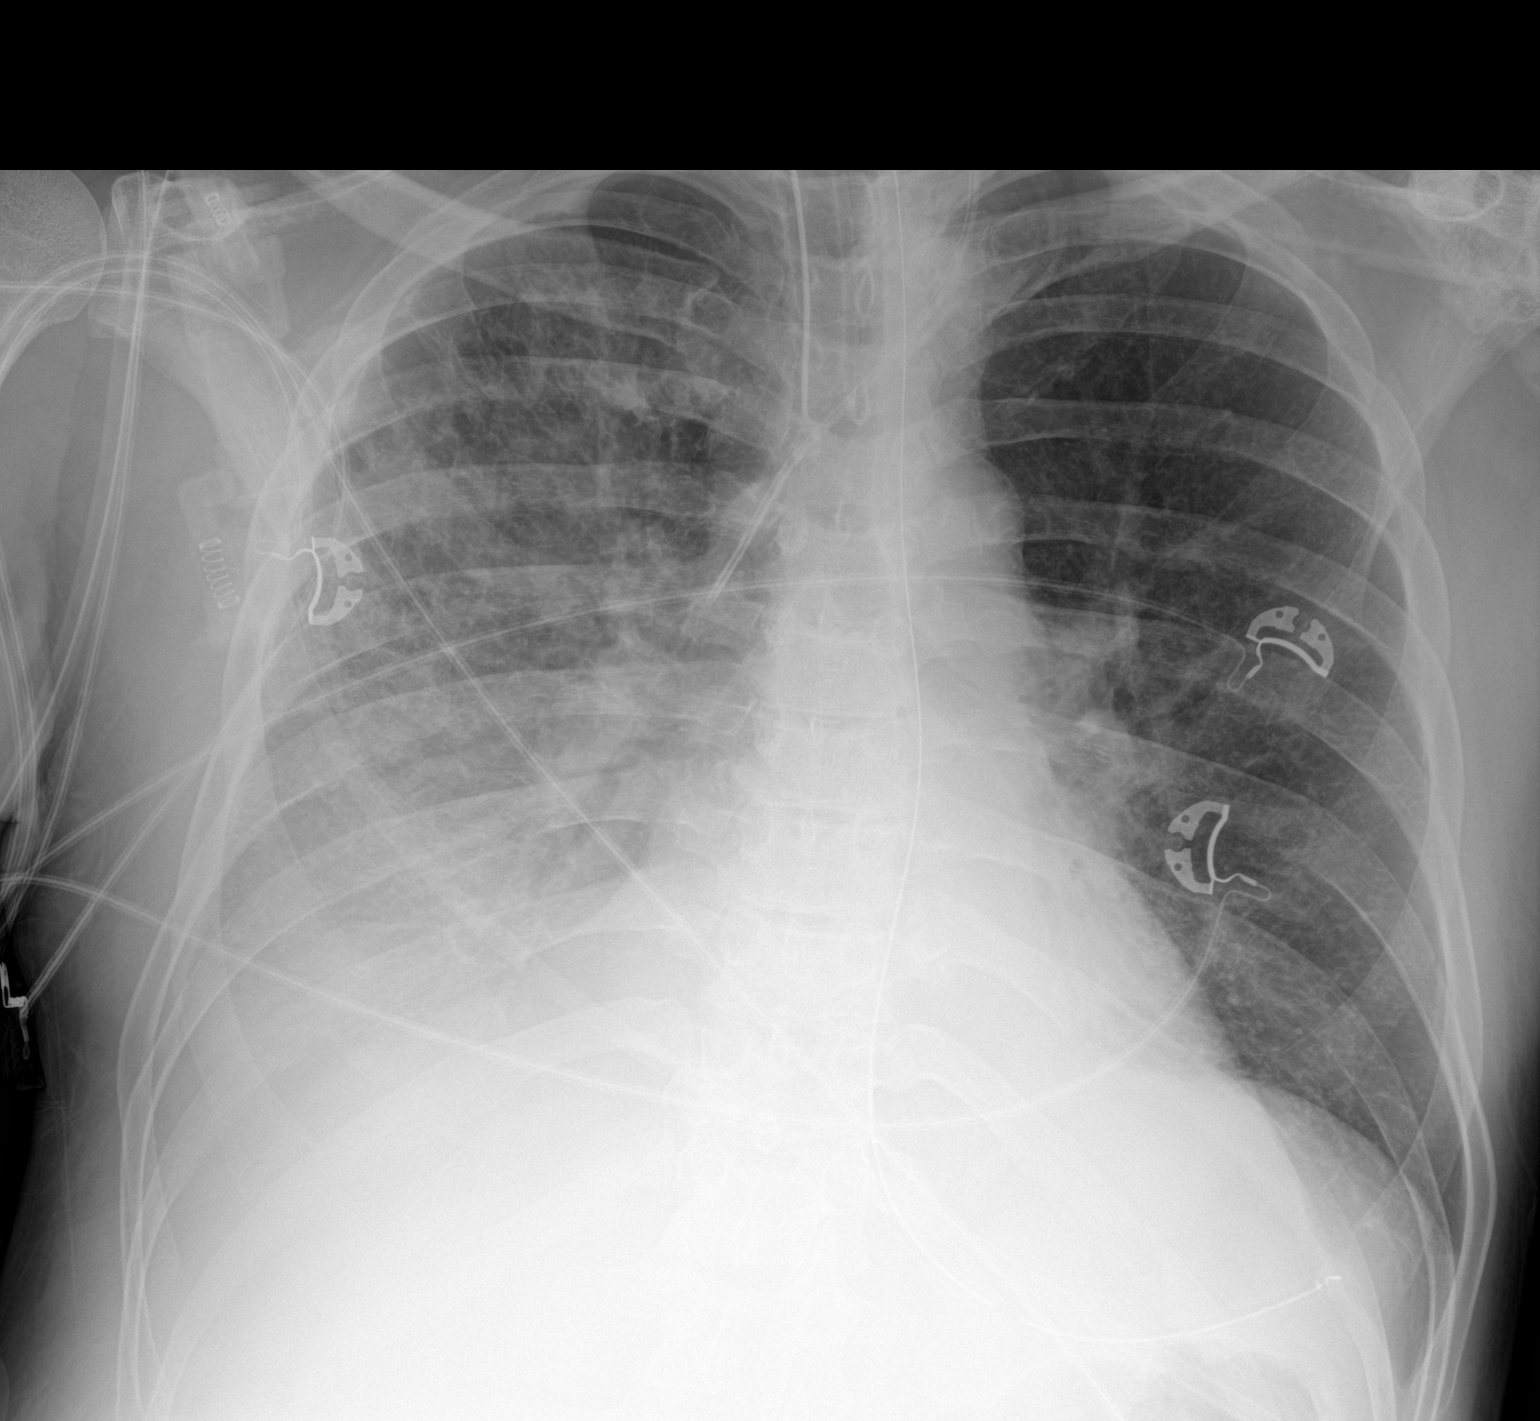

[1 of 1 positions shown; findings below may reference images not displayed]

FINDINGS: Endotracheal tube, nasogastric catheter and left jugular central
line are again seen and stable. Cardiac shadow is stable. Left lung
remains clear. Increasing opacity in the right lung is noted with
associated increasing effusion.
IMPRESSION: Increasing opacity in the right lung with increasing effusion. This
may be related to asymmetric edema or increased infectious
infiltrate.
